# Patient Record
Sex: Female | Born: 1940
Health system: Southern US, Community
[De-identification: ages and names within clinical notes are randomized; demographics above are authoritative.]

## PROBLEM LIST (undated history)

## (undated) DIAGNOSIS — K219 Gastro-esophageal reflux disease without esophagitis: Secondary | ICD-10-CM

## (undated) DIAGNOSIS — H269 Unspecified cataract: Secondary | ICD-10-CM

## (undated) DIAGNOSIS — M1990 Unspecified osteoarthritis, unspecified site: Secondary | ICD-10-CM

## (undated) DIAGNOSIS — Z860100 Personal history of colon polyps, unspecified: Secondary | ICD-10-CM

## (undated) DIAGNOSIS — F419 Anxiety disorder, unspecified: Secondary | ICD-10-CM

## (undated) HISTORY — DX: Anxiety disorder, unspecified: F41.9

## (undated) HISTORY — DX: Personal history of colonic polyps: Z86.010

## (undated) HISTORY — PX: CATARACT EXTRACTION: SUR2

## (undated) HISTORY — DX: Unspecified osteoarthritis, unspecified site: M19.90

## (undated) HISTORY — DX: Unspecified cataract: H26.9

## (undated) HISTORY — DX: Personal history of colon polyps, unspecified: Z86.0100

## (undated) HISTORY — PX: OVARIAN CYST REMOVAL: SHX89

## (undated) HISTORY — DX: Gastro-esophageal reflux disease without esophagitis: K21.9

---

## 2008-12-10 HISTORY — PX: COLONOSCOPY: SHX174

## 2019-05-20 ENCOUNTER — Telehealth: Payer: Self-pay | Admitting: *Deleted

## 2019-05-20 NOTE — Telephone Encounter (Signed)
Left message with female for patient to call back for more information.

## 2019-05-20 NOTE — Telephone Encounter (Signed)
Copied from [place] [phone]. Topic: Appointment Scheduling - Scheduling Inquiry for Clinic >> May 20, 2019 11:23 AM Susan Goodman E wrote: Reason for CRM: Pt wants to establish care with Dr. Jerilee Hoh and wants to get in as soon as possible due to abdominal issue/ please advise

## 2019-05-28 ENCOUNTER — Ambulatory Visit: Payer: Medicare (Managed Care) | Admitting: Internal Medicine

## 2019-05-29 ENCOUNTER — Other Ambulatory Visit: Payer: Self-pay

## 2019-05-29 ENCOUNTER — Ambulatory Visit (INDEPENDENT_AMBULATORY_CARE_PROVIDER_SITE_OTHER): Payer: Medicare Other | Admitting: Internal Medicine

## 2019-05-29 ENCOUNTER — Encounter: Payer: Self-pay | Admitting: Internal Medicine

## 2019-05-29 VITALS — BP 110/64 | HR 68 | Temp 99.0°F | Ht 62.0 in | Wt 163.6 lb

## 2019-05-29 DIAGNOSIS — F419 Anxiety disorder, unspecified: Secondary | ICD-10-CM | POA: Diagnosis not present

## 2019-05-29 DIAGNOSIS — K921 Melena: Secondary | ICD-10-CM | POA: Insufficient documentation

## 2019-05-29 LAB — CBC WITH DIFFERENTIAL/PLATELET
Basophils Absolute: 0 10*3/uL (ref 0.0–0.1)
Basophils Relative: 0.7 % (ref 0.0–3.0)
Eosinophils Absolute: 0.1 10*3/uL (ref 0.0–0.7)
Eosinophils Relative: 1.7 % (ref 0.0–5.0)
HCT: 37.2 % (ref 36.0–46.0)
Hemoglobin: 12.4 g/dL (ref 12.0–15.0)
Lymphocytes Relative: 40.6 % (ref 12.0–46.0)
Lymphs Abs: 1.6 10*3/uL (ref 0.7–4.0)
MCHC: 33.3 g/dL (ref 30.0–36.0)
MCV: 91.7 fl (ref 78.0–100.0)
Monocytes Absolute: 0.3 10*3/uL (ref 0.1–1.0)
Monocytes Relative: 7.6 % (ref 3.0–12.0)
Neutro Abs: 2 10*3/uL (ref 1.4–7.7)
Neutrophils Relative %: 49.4 % (ref 43.0–77.0)
Platelets: 217 10*3/uL (ref 150.0–400.0)
RBC: 4.06 Mil/uL (ref 3.87–5.11)
RDW: 13.7 % (ref 11.5–15.5)
WBC: 4 10*3/uL (ref 4.0–10.5)

## 2019-05-29 LAB — COMPREHENSIVE METABOLIC PANEL
ALT: 30 U/L (ref 0–35)
AST: 24 U/L (ref 0–37)
Albumin: 3.9 g/dL (ref 3.5–5.2)
Alkaline Phosphatase: 69 U/L (ref 39–117)
BUN: 18 mg/dL (ref 6–23)
CO2: 32 mEq/L (ref 19–32)
Calcium: 9.2 mg/dL (ref 8.4–10.5)
Chloride: 103 mEq/L (ref 96–112)
Creatinine, Ser: 0.79 mg/dL (ref 0.40–1.20)
GFR: 70.43 mL/min (ref >60.00)
Glucose, Bld: 81 mg/dL (ref 70–99)
Potassium: 4.3 mEq/L (ref 3.5–5.1)
Sodium: 142 mEq/L (ref 135–145)
Total Bilirubin: 0.3 mg/dL (ref 0.2–1.2)
Total Protein: 5.9 g/dL — ABNORMAL LOW (ref 6.0–8.3)

## 2019-05-29 MED ORDER — PANTOPRAZOLE SODIUM 40 MG PO TBEC: 40.0000 mg | DELAYED_RELEASE_TABLET | Freq: Every day | ORAL | 2 refills | Status: DC

## 2019-05-29 MED ORDER — SERTRALINE HCL 50 MG PO TABS: 50.0000 mg | ORAL_TABLET | Freq: Every day | ORAL | 3 refills | Status: DC

## 2019-05-29 NOTE — Progress Notes (Signed)
New Patient Office Visit     CC/Reason for Visit: Establish care, discuss acute concerns including black stools and increased anxiety Previous PCP: In [place] Last Visit: 2019  HPI: Susan Goodman is a 78 y.o. female who is coming in today for the above mentioned reasons.  She has no past medical history of significance.  About 6 weeks ago she was started on Zoloft and trazodone due to heightened anxiety and stress surrounding the COVID-19 pandemic.  She has moved to [place] to be closer to her niece. She takes trazodone rarely, but niece feels like her anxiety is still significant: she is afraid to be alone at home for example.  The major concern today is black stools. This started about 3 weeks ago but has become more frequent. She has a mild "nagging" pain over her lower abdomen. She has been constipated and has had relief with prune juice. No nausea or vomiting, no diarrhea. She does not use NSAIDS ever, does not drink ETOH; no h/o GERD/PUD or hiatal hernias. Over the past week her niece has noted increased fatigue and mild DOE. No dizziness or weakness. She had a colonoscopy 15 years ago in [place], had polyps removed and was asked to return in 5 years but she never did.   Past Medical/Surgical History: Past Medical History:  Diagnosis Date  . Anxiety     History reviewed. No pertinent surgical history.  Social History:  reports that she has never smoked. She has never used smokeless tobacco. She reports that she does not drink alcohol. No history on file for drug.  Allergies: No Known Allergies  Family History:  History reviewed. No pertinent family history. Specifically, no heart disease, stroke or cancers that they are aware of.   Current Outpatient Medications:  .  traZODone (DESYREL) 50 MG tablet, Take 50 mg by mouth at bedtime., Disp: , Rfl:  .  pantoprazole (PROTONIX) 40 MG tablet, Take 1 tablet (40 mg total) by mouth daily., Disp: 30 tablet, Rfl: 2 .   sertraline (ZOLOFT) 50 MG tablet, Take 1 tablet (50 mg total) by mouth daily., Disp: 30 tablet, Rfl: 3  Review of Systems:  Constitutional: Denies fever, chills, diaphoresis, appetite change. HEENT: Denies photophobia, eye pain, redness, hearing loss, ear pain, congestion, sore throat, rhinorrhea, sneezing, mouth sores, trouble swallowing, neck pain, neck stiffness and tinnitus.   Respiratory: Denies  cough, chest tightness,  and wheezing.   Cardiovascular: Denies chest pain, palpitations and leg swelling.  Gastrointestinal: Denies nausea, vomiting,  Diarrhea and abdominal distention.  Genitourinary: Denies dysuria, urgency, frequency, hematuria, flank pain and difficulty urinating.  Endocrine: Denies: hot or cold intolerance, sweats, changes in hair or nails, polyuria, polydipsia. Musculoskeletal: Denies myalgias, back pain, joint swelling, arthralgias and gait problem.  Skin: Denies pallor, rash and wound.  Neurological: Denies dizziness, seizures, syncope, weakness, light-headedness, numbness and headaches.  Hematological: Denies adenopathy. Easy bruising, personal or family bleeding history  Psychiatric/Behavioral: Denies suicidal ideation, mood changes, confusion, nervousness, sleep disturbance and agitation    Physical Exam: Vitals:   05/29/19 1406  BP: 110/64  Pulse: 68  Temp: 99 F (37.2 C)  TempSrc: Oral  SpO2: 94%  Weight: 163 lb 9.6 oz (74.2 kg)  Height: 5\' 2"  (1.575 m)   Body mass index is 29.92 kg/m.  Constitutional: NAD, calm, comfortable Eyes: PERRL, lids and conjunctivae normal ENMT: Mucous membranes are moist.  Respiratory: clear to auscultation bilaterally, no wheezing, no crackles. Normal respiratory effort. No accessory muscle use.  Cardiovascular: Regular rate and rhythm, no murmurs / rubs / gallops. No extremity edema. 2+ pedal pulses. No carotid bruits.  Abdomen: no tenderness, no masses palpated. No hepatosplenomegaly. Bowel sounds positive.   Musculoskeletal: no clubbing / cyanosis. No joint deformity upper and lower extremities. Good ROM, no contractures. Normal muscle tone.  Neurologic: grossly intact and non-focal Psychiatric: Normal judgment and insight. Alert and oriented x 3. Normal mood.    Impression and Plan:  Melena -Start PPI. -Check CBC/CMET/iron studies today. -Avoid any NSAID use (she doesn't currently). -Will initiate urgent referral to GI. -Since BP ok and not BRBPR, I believe it is ok to evaluate in the OP setting for now. -Patient and niece advised to proceed to the ED this weekend if she has increased bleeding, if bleeding turns red/maroon in color or if she has weakness or dizziness.   Anxiety -Agree with increasing zoloft from 25 to 50 mg daily. -Continue to use trazodone PRN for sleep only.     Patient Instructions  -Nice meeting you today.  -Lab work today; will notify you when results are available.  -Urgent GI referral today to assess your black stools.  -Start taking protonix 40 mg daily.  -Increase zoloft to 50 mg daily.     Lelon Frohlich, MD [facility]

## 2019-05-29 NOTE — Patient Instructions (Signed)
-  Nice meeting you today.  -Lab work today; will notify you when results are available.  -Urgent GI referral today to assess your black stools.  -Start taking protonix 40 mg daily.  -Increase zoloft to 50 mg daily.

## 2019-05-30 LAB — SPECIMEN STATUS REPORT

## 2019-06-02 LAB — ANEMIA PANEL
Ferritin: 132 ng/mL (ref 15–150)
Hematocrit: 38.6 % (ref 34.0–46.6)
Iron Saturation: 37 % (ref 15–55)
Iron: 93 ug/dL (ref 27–139)
Retic Ct Pct: 0.7 % (ref 0.6–2.6)
Total Iron Binding Capacity: 254 ug/dL (ref 250–450)
UIBC: 161 ug/dL (ref 118–369)
Vitamin B-12: 750 pg/mL (ref 232–1245)

## 2019-06-09 ENCOUNTER — Telehealth: Payer: Self-pay | Admitting: General Surgery

## 2019-06-09 NOTE — Telephone Encounter (Signed)
Covid-19 screening questions   Do you now or have you had a fever in the last 14 days?NO  Do you have any respiratory symptoms of shortness of breath or cough now or in the last 14 days? NO  Do you have any family members or close contacts with diagnosed or suspected Covid-19 in the past 14 days? No  Have you been tested for Covid-19 and found to be positive? No  Patient and healthcare partner( Niece) informed to wear a mask to the clinic. Patient and niece verbalized understanding.

## 2019-06-10 ENCOUNTER — Telehealth: Payer: Self-pay | Admitting: Internal Medicine

## 2019-06-10 ENCOUNTER — Telehealth: Payer: Self-pay | Admitting: *Deleted

## 2019-06-10 ENCOUNTER — Encounter: Payer: Self-pay | Admitting: Nurse Practitioner

## 2019-06-10 ENCOUNTER — Ambulatory Visit (INDEPENDENT_AMBULATORY_CARE_PROVIDER_SITE_OTHER): Payer: Medicare Other | Admitting: Nurse Practitioner

## 2019-06-10 ENCOUNTER — Other Ambulatory Visit: Payer: Self-pay

## 2019-06-10 VITALS — BP 116/76 | HR 85 | Temp 96.7°F | Ht 62.0 in | Wt 161.4 lb

## 2019-06-10 DIAGNOSIS — Z86010 Personal history of colonic polyps: Secondary | ICD-10-CM | POA: Diagnosis not present

## 2019-06-10 DIAGNOSIS — K921 Melena: Secondary | ICD-10-CM

## 2019-06-10 MED ORDER — NA SULFATE-K SULFATE-MG SULF 17.5-3.13-1.6 GM/177ML PO SOLN: ORAL | 0 refills | Status: DC

## 2019-06-10 NOTE — Progress Notes (Signed)
Assessment and plan reviewed 

## 2019-06-10 NOTE — Telephone Encounter (Signed)
Copied from [place] [phone]. Topic: General - Other >> Jun 10, 2019 12:14 PM Mathis Bud wrote: Reason for CRM: patient niece called stating patient is requesting a nurse to call back for lab results.  Patient called on 6/29 and never received a call back Call back [phone]

## 2019-06-10 NOTE — Patient Instructions (Addendum)
If you are age 78 or older, your body mass index should be between 23-30. Your Body mass index is 29.52 kg/m. If this is out of the aforementioned range listed, please consider follow up with your Primary Care Provider.  If you are age 83 or younger, your body mass index should be between 19-25. Your Body mass index is 29.52 kg/m. If this is out of the aformentioned range listed, please consider follow up with your Primary Care Provider.   You have been scheduled for an endoscopy and colonoscopy. Please follow the written instructions given to you at your visit today. Please pick up your prep supplies at the pharmacy within the next 1-3 days. If you use inhalers (even only as needed), please bring them with you on the day of your procedure. Your physician has requested that you go to www.startemmi.com and enter the access code given to you at your visit today. This web site gives a general overview about your procedure. However, you should still follow specific instructions given to you by our office regarding your preparation for the procedure.  We have sent the following medications to your pharmacy for you to pick up at your convenience: Suprep Continue Protonix.   Thank you for choosing me and [facility].   Tye Savoy, NP

## 2019-06-10 NOTE — Telephone Encounter (Signed)
Patients niece Angela Cox) called stating she is requesting referral to a neurologist due to  Patient had a fall in [place] early April and went to [facility] and had a CT. Gwenette Greet states she believes they did see something on CT.   Patient would like the neurologist referral to Lenor Coffin, MD at [facility]  Call back # [phone]

## 2019-06-10 NOTE — Progress Notes (Signed)
ASSESSMENT / PLAN:   50.  78 year old electively healthy female with intermittent black stools since May.  Hemoglobin 12.4, no baseline available.  Labs do not suggest iron deficiency. -For further evaluation patient will be scheduled for EGD.  The risks and benefits of EGD were discussed and the patient agrees to proceed.  -Continue PPI in the interim  2.  History of colon polyps in [place], [place] approximately 15 years ago.  Patient was advised to have repeat colonoscopy in 5 years but did not have it done.  -Patient is amazingly healthy, she is interested in pursuing colonoscopy at the time of EGD. The risks and benefits of colonoscopy with possible polypectomy / biopsies were discussed and the patient agrees to proceed.     HPI:    Chief Complaint:   Black stools Patient is a 78 year old female, new to the practice, recently relocated from [place] .  She is referred by PCP Rande Lawman, MD for evaluation of black stool.  Describes intermittent black stools since May.  She takes no NSAIDs.  No Pepto-Bismol, no iron tablets.  Black stool which has occurred intermittently since May.  Patient saw PCP on 6/19, hemoglobin in 12 range.  Patient has occasional vague right mid abdominal discomfort not related to eating, bowel movements, physical activity.  She takes prunes every day to keep her bowels moving.  No history of rectal bleeding.  Overall patient feels well from a GI and general medical standpoint.  Data Reviewed:  05/29/19 Hgb 12.4, platelets 217, MCV 91.  Ferritin 132, TIBC 250, iron saturation 37%   Past Medical History:  Diagnosis Date  . Anxiety     Past Surgical History:  Procedure Laterality Date  . COLONOSCOPY  2010   In [place]   . OVARIAN CYST REMOVAL     1980's   Family History  Problem Relation Age of Onset  . Colon cancer Father   . Esophageal cancer Neg Hx    Social History   Tobacco Use  . Smoking status: Never Smoker  .  Smokeless tobacco: Never Used  Substance Use Topics  . Alcohol use: Never    Frequency: Never  . Drug use: Never   Current Outpatient Medications  Medication Sig Dispense Refill  . loratadine (CLARITIN) 10 MG tablet Take 10 mg by mouth daily.    . sertraline (ZOLOFT) 50 MG tablet Take 1 tablet (50 mg total) by mouth daily. 30 tablet 3  . traZODone (DESYREL) 50 MG tablet Take 50 mg by mouth as needed.     . pantoprazole (PROTONIX) 40 MG tablet Take 1 tablet (40 mg total) by mouth daily. (Patient not taking: Reported on 06/10/2019) 30 tablet 2   No current facility-administered medications for this visit.    No Known Allergies   Review of Systems: All systems reviewed and negative except where noted in HPI.   Serum creatinine: 0.79 mg/dL 05/29/19 1441 Estimated creatinine clearance: 55.1 mL/min   Physical Exam:    Wt Readings from Last 3 Encounters:  06/10/19 161 lb 6 oz (73.2 kg)  05/29/19 163 lb 9.6 oz (74.2 kg)    BP 116/76   Pulse 85   Temp (!) 96.7 F (35.9 C) (Temporal)   Ht 5\' 2"  (1.575 m)   Wt 161 lb 6 oz (73.2 kg)   BMI 29.52 kg/m  Constitutional:  Pleasant female in no acute distress. Psychiatric: Normal mood  and affect. Behavior is normal. EENT: Pupils normal.  Conjunctivae are normal. No scleral icterus. Neck supple.  Cardiovascular: Normal rate, regular rhythm. No edema Pulmonary/chest: Effort normal and breath sounds normal. No wheezing, rales or rhonchi. Abdominal: Soft, nondistended, nontender. Bowel sounds active throughout. There are no masses palpable. No hepatomegaly. Neurological: Alert and oriented to person place and time. Skin: Skin is warm and dry. No rashes noted.  Tye Savoy, NP  06/10/2019, 10:53 AM  Cc: Isaac Bliss, [place]*

## 2019-06-11 NOTE — Telephone Encounter (Signed)
Patient is aware of lab results.

## 2019-06-11 NOTE — Telephone Encounter (Signed)
Spoke with [facility] and new patient paperwork with ROI will be picked up from the office.

## 2019-06-11 NOTE — Telephone Encounter (Signed)
Could we possibly find out what was seen on CT? I need a reason for referral. Thanks

## 2019-06-17 ENCOUNTER — Telehealth: Payer: Self-pay | Admitting: *Deleted

## 2019-06-17 NOTE — Telephone Encounter (Signed)
Copied from [place] [phone]. Topic: General - Other >> Jun 17, 2019 11:26 AM Lennox Solders wrote: Reason for CRM: pt is in needs of new handicap renewal form. Pt move from [place] and had handicap plate. Pt needs a new one due to knee and unable to walk that far.

## 2019-06-22 ENCOUNTER — Telehealth: Payer: Self-pay | Admitting: Nurse Practitioner

## 2019-06-22 ENCOUNTER — Telehealth: Payer: Self-pay | Admitting: Internal Medicine

## 2019-06-22 NOTE — Telephone Encounter (Signed)
Ivar Bury called to inform that request was denied for authorisation; number for appeal [phone].  They will send over denial letter.

## 2019-06-22 NOTE — Telephone Encounter (Signed)
Patient niece called said that the suprep was to expensive and would like something else called in. They need it sent by today to take it tomorrow.

## 2019-06-22 NOTE — Telephone Encounter (Signed)
Patient is coming by the office to pick up [place].

## 2019-06-23 ENCOUNTER — Telehealth: Payer: Self-pay | Admitting: Internal Medicine

## 2019-06-23 NOTE — Telephone Encounter (Signed)
Yes, do I need to sign?

## 2019-06-23 NOTE — Telephone Encounter (Signed)
Spoke with patient regarding Covid-19 screening questions. Covid-19 Screening Questions:  Do you now or have you had a fever in the last 14 days?   Do you have any respiratory symptoms of shortness of breath or cough now or in the last 14 days?   Do you have any family members or close contacts with diagnosed or suspected Covid-19 in the past 14 days?   Have you been tested for Covid-19 and found to be positive?

## 2019-06-23 NOTE — Telephone Encounter (Signed)
Okay to give. 

## 2019-06-23 NOTE — Telephone Encounter (Signed)
Informed niece that the form is ready to pick up.

## 2019-06-23 NOTE — Telephone Encounter (Signed)
Pt returned call: ° °Do you now or have you had a fever in the last 14 days?     No °  °Do you have any respiratory symptoms of shortness of breath or cough now or in the last 14 days?    No °  °Do you have any family members or close contacts with diagnosed or suspected Covid-19 in the past 14 days?  No °  °Have you been tested for Covid-19 and found to be positive?    No °  °Pt made aware of that care partner may come to the lobby during the procedure but will need to provide their own mask. ° ° °

## 2019-06-24 ENCOUNTER — Encounter: Payer: Medicare (Managed Care) | Admitting: Internal Medicine

## 2019-06-24 ENCOUNTER — Telehealth: Payer: Self-pay | Admitting: Internal Medicine

## 2019-06-24 NOTE — Telephone Encounter (Signed)
Patient niece called back. She stated that she is going to cancel however do not want to be charged because she was not aware wasn't approved until the day of. Please call back and advise thanks.

## 2019-06-24 NOTE — Telephone Encounter (Signed)
Patient niece upset because insurance denied ecl that is scheduled for today.  I spoke to [employer] and they state that then only cover [place] approved facilities and in the state of [place]. They mailed pt a letter 07/13 but the address on file was [place] not [place].

## 2019-06-26 NOTE — Telephone Encounter (Signed)
Patient niece called said that the patient is needing to reschedule her procedure since she will be having a new insurance on July 11, 2019. I told her that she will need to call on 07/13/19 to give [place] her new insurance to make sure we do take it.

## 2019-07-09 ENCOUNTER — Telehealth: Payer: Self-pay | Admitting: *Deleted

## 2019-07-09 DIAGNOSIS — F419 Anxiety disorder, unspecified: Secondary | ICD-10-CM

## 2019-07-09 DIAGNOSIS — Z9181 History of falling: Secondary | ICD-10-CM

## 2019-07-09 NOTE — Telephone Encounter (Signed)
Copied from [place] [phone]. Topic: General - Other >> Jul 06, 2019  9:57 AM Sheran Luz wrote: Patient's niece requesting call back from CMA to discuss whether unspecified form has been sent out. She states she has contacted office previously (no documentation) >> Jul 08, 2019  2:09 PM Jodie Echevaria wrote: Patient niece called back to speak to Apolonio Schneiders or Dr Jerilee Hoh asking about sending out to get some reports from another facility for the patient to help aide in her proper care. Please call Colin Broach at  Ph#  [phone] >> Jul 06, 2019 10:18 AM Cox, Melburn Hake, CMA wrote: Caller not on [facility]

## 2019-07-09 NOTE — Telephone Encounter (Signed)
Copied from [place] [phone]. Topic: General - Other >> Jul 06, 2019  9:57 AM Sheran Luz wrote: Patient's niece requesting call back from CMA to discuss whether unspecified form has been sent out. She states she has contacted office previously (no documentation) >> Jul 08, 2019  2:09 PM Jodie Echevaria wrote: Patient niece called back to speak to Apolonio Schneiders or Dr Jerilee Hoh asking about sending out to get some reports from another facility for the patient to help aide in her proper care. Please call Colin Broach at  Ph#  [phone] >> Jul 06, 2019 10:18 AM Cox, Melburn Hake, CMA wrote: Caller not on [facility]

## 2019-07-09 NOTE — Telephone Encounter (Signed)
Okay to refer to home health?

## 2019-07-09 NOTE — Telephone Encounter (Signed)
Yes

## 2019-07-09 NOTE — Telephone Encounter (Signed)
Patient's niece called in requesting a call back from the [place]. States she is unsure why it is taking so long. Call back is [phone].

## 2019-07-10 ENCOUNTER — Other Ambulatory Visit: Payer: Self-pay

## 2019-07-10 ENCOUNTER — Ambulatory Visit (AMBULATORY_SURGERY_CENTER): Payer: Medicare (Managed Care)

## 2019-07-10 VITALS — Ht 62.0 in | Wt 161.0 lb

## 2019-07-10 DIAGNOSIS — K921 Melena: Secondary | ICD-10-CM

## 2019-07-10 DIAGNOSIS — Z86010 Personal history of colonic polyps: Secondary | ICD-10-CM

## 2019-07-10 MED ORDER — NA SULFATE-K SULFATE-MG SULF 17.5-3.13-1.6 GM/177ML PO SOLN: 1.0000 | Freq: Once | ORAL | 0 refills | Status: AC

## 2019-07-10 NOTE — Progress Notes (Signed)
Denies allergies to eggs or soy products. Denies complication of anesthesia or sedation. Denies use of weight loss medication. Denies use of O2.   Emmi instructions given for colonoscopy.  Pre-Visit was conducted by phone due to Covid 19. Instructions were reviewed with patients niece who oversee's her care. A sample of Suprep was given to the patient because she did a prep previously but the procedure had to be cancelled due to insurance. Patient now has a new insurance card and it will be scanned into our system on Monday when the patient picks up the prep. Patient was encouraged to call if she had any questions regarding instructions.

## 2019-07-10 NOTE — Telephone Encounter (Signed)
Orders placed.

## 2019-07-21 ENCOUNTER — Other Ambulatory Visit: Payer: Self-pay | Admitting: Internal Medicine

## 2019-07-21 DIAGNOSIS — Z1231 Encounter for screening mammogram for malignant neoplasm of breast: Secondary | ICD-10-CM

## 2019-07-22 ENCOUNTER — Telehealth: Payer: Self-pay | Admitting: Internal Medicine

## 2019-07-22 DIAGNOSIS — R9089 Other abnormal findings on diagnostic imaging of central nervous system: Secondary | ICD-10-CM

## 2019-07-22 NOTE — Telephone Encounter (Signed)

## 2019-07-22 NOTE — Telephone Encounter (Signed)
Fine with me

## 2019-07-22 NOTE — Telephone Encounter (Signed)
Referral placed.

## 2019-07-22 NOTE — Telephone Encounter (Signed)
Copied from [place] [phone]. Topic: Referral - Status >> Jul 22, 2019  9:04 AM Antonieta Iba C wrote: Reason for CRM: pt's niece Gwenette Greet called in to request a referral to Dr. Jannifer Franklin at [facility].   Please assist   CB: [phone] Gwenette Greet

## 2019-07-23 ENCOUNTER — Encounter: Payer: Self-pay | Admitting: Internal Medicine

## 2019-07-23 ENCOUNTER — Ambulatory Visit (AMBULATORY_SURGERY_CENTER): Payer: Medicare Other | Admitting: Internal Medicine

## 2019-07-23 ENCOUNTER — Other Ambulatory Visit: Payer: Self-pay

## 2019-07-23 VITALS — BP 112/80 | HR 84 | Temp 98.5°F | Resp 19 | Ht 62.0 in | Wt 161.0 lb

## 2019-07-23 DIAGNOSIS — D122 Benign neoplasm of ascending colon: Secondary | ICD-10-CM | POA: Diagnosis not present

## 2019-07-23 DIAGNOSIS — K921 Melena: Secondary | ICD-10-CM | POA: Diagnosis not present

## 2019-07-23 DIAGNOSIS — Z86010 Personal history of colonic polyps: Secondary | ICD-10-CM

## 2019-07-23 DIAGNOSIS — D124 Benign neoplasm of descending colon: Secondary | ICD-10-CM

## 2019-07-23 DIAGNOSIS — K5730 Diverticulosis of large intestine without perforation or abscess without bleeding: Secondary | ICD-10-CM | POA: Diagnosis not present

## 2019-07-23 DIAGNOSIS — D125 Benign neoplasm of sigmoid colon: Secondary | ICD-10-CM | POA: Diagnosis not present

## 2019-07-23 DIAGNOSIS — R195 Other fecal abnormalities: Secondary | ICD-10-CM | POA: Diagnosis not present

## 2019-07-23 MED ORDER — SODIUM CHLORIDE 0.9 % IV SOLN: 500.0000 mL | Freq: Once | INTRAVENOUS | Status: DC

## 2019-07-23 NOTE — Op Note (Signed)
[place] Patient Name: Susan Goodman Procedure Date: 07/23/2019 3:36 PM MRN: [id number] Endoscopist: Docia Chuck. Henrene Pastor , MD Age: 78 Referring MD:  Date of Birth: 1941/07/17 Gender: Female Account #: [id number] Procedure:                Upper GI endoscopy Indications:              Melena (reports of dark stools) hemoglobin 12.4 Medicines:                Monitored Anesthesia Care Procedure:                Pre-Anesthesia Assessment:                           - Prior to the procedure, a History and Physical                            was performed, and patient medications and                            allergies were reviewed. The patient's tolerance of                            previous anesthesia was also reviewed. The risks                            and benefits of the procedure and the sedation                            options and risks were discussed with the patient.                            All questions were answered, and informed consent                            was obtained. Prior Anticoagulants: The patient has                            taken no previous anticoagulant or antiplatelet                            agents. ASA Grade Assessment: II - A patient with                            mild systemic disease. After reviewing the risks                            and benefits, the patient was deemed in                            satisfactory condition to undergo the procedure.                           After obtaining informed consent, the endoscope was  passed under direct vision. Throughout the                            procedure, the patient's blood pressure, pulse, and                            oxygen saturations were monitored continuously. The                            Endoscope was introduced through the mouth, and                            advanced to the second part of duodenum. The upper                            GI  endoscopy was accomplished without difficulty.                            The patient tolerated the procedure well. Scope In: Scope Out: Findings:                 The esophagus was normal.                           The stomach was normal.                           The examined duodenum was normal.                           The cardia and gastric fundus were normal on                            retroflexion. Complications:            No immediate complications. Estimated Blood Loss:     Estimated blood loss: none. Impression:               - Normal esophagus.                           - Normal stomach.                           - Normal examined duodenum.                           - No specimens collected. Recommendation:           - Patient has a contact number available for                            emergencies. The signs and symptoms of potential                            delayed complications were discussed with the  patient. Return to normal activities tomorrow.                            Written discharge instructions were provided to the                            patient.                           - Resume previous diet.                           - Continue present medications.                           - Return to the care of your primary provider Docia Chuck. Henrene Pastor, MD 07/23/2019 4:22:54 PM This report has been signed electronically.

## 2019-07-23 NOTE — Progress Notes (Signed)
Called to room to assist during endoscopic procedure.  Patient ID and intended procedure confirmed with present staff. Received instructions for my participation in the procedure from the performing physician.  

## 2019-07-23 NOTE — Progress Notes (Signed)
PT taken to PACU. Monitors in place. VSS. Report given to RN. 

## 2019-07-23 NOTE — Op Note (Signed)
[place] Patient Name: Susan Goodman Procedure Date: 07/23/2019 3:38 PM MRN: [id number] Endoscopist: Docia Chuck. Henrene Pastor , MD Age: 78 Referring MD:  Date of Birth: [birth year] Gender: Female Account #: [id number] Procedure:                Colonoscopy with cold snare polypectomy x 3 Indications:              High risk colon cancer surveillance: Personal                            history of colonic polyps. Reports having had colon                            polyps in [place] 15 years ago. Was told to                            follow-up in 5 years but has not followed up until                            this time. Was sent to this office regarding this                            issue as well as complaints of "dark stools". Now                            for colonoscopy Medicines:                Monitored Anesthesia Care Procedure:                Pre-Anesthesia Assessment:                           - Prior to the procedure, a History and Physical                            was performed, and patient medications and                            allergies were reviewed. The patient's tolerance of                            previous anesthesia was also reviewed. The risks                            and benefits of the procedure and the sedation                            options and risks were discussed with the patient.                            All questions were answered, and informed consent                            was obtained. Prior Anticoagulants: The patient has  taken no previous anticoagulant or antiplatelet                            agents. ASA Grade Assessment: II - A patient with                            mild systemic disease. After reviewing the risks                            and benefits, the patient was deemed in                            satisfactory condition to undergo the procedure.                           After obtaining  informed consent, the colonoscope                            was passed under direct vision. Throughout the                            procedure, the patient's blood pressure, pulse, and                            oxygen saturations were monitored continuously. The                            Colonoscope was introduced through the anus and                            advanced to the the cecum, identified by                            appendiceal orifice and ileocecal valve. The                            ileocecal valve, appendiceal orifice, and rectum                            were photographed. The quality of the bowel                            preparation was good. The colonoscopy was performed                            without difficulty. The patient tolerated the                            procedure well. The bowel preparation used was                            SUPREP via split dose instruction. Scope In: 3:45:17 PM Scope Out: 4:06:20 PM Scope Withdrawal Time: 0 hours 10 minutes 46 seconds  Total Procedure Duration: 0  hours 21 minutes 3 seconds  Findings:                 Three polyps were found in the sigmoid colon,                            descending colon and ascending colon. The polyps                            were 3 to 5 mm in size. These polyps were removed                            with a cold snare. Resection and retrieval were                            complete.                           Diverticula were found in the sigmoid colon.                           The exam was otherwise without abnormality on                            direct and retroflexion views. Complications:            No immediate complications. Estimated blood loss:                            None. Estimated Blood Loss:     Estimated blood loss: none. Impression:               - Three 3 to 5 mm polyps in the sigmoid colon, in                            the descending colon and in the ascending  colon,                            removed with a cold snare. Resected and retrieved.                           - Diverticulosis in the sigmoid colon.                           - The examination was otherwise normal on direct                            and retroflexion views. Recommendation:           - Repeat colonoscopy is not recommended for                            surveillance.                           - Patient has a contact number available for  emergencies. The signs and symptoms of potential                            delayed complications were discussed with the                            patient. Return to normal activities tomorrow.                            Written discharge instructions were provided to the                            patient.                           - Resume previous diet.                           - Continue present medications.                           - Await pathology results. Docia Chuck. Henrene Pastor, MD 07/23/2019 4:20:14 PM This report has been signed electronically.

## 2019-07-23 NOTE — Patient Instructions (Signed)
Handouts given for polyps, diverticulosis and high fiber diet.  YOU HAD AN ENDOSCOPIC PROCEDURE TODAY AT [facility]:   Refer to the procedure report that was given to you for any specific questions about what was found during the examination.  If the procedure report does not answer your questions, please call your gastroenterologist to clarify.  If you requested that your care partner not be given the details of your procedure findings, then the procedure report has been included in a sealed envelope for you to review at your convenience later.  YOU SHOULD EXPECT: Some feelings of bloating in the abdomen. Passage of more gas than usual.  Walking can help get rid of the air that was put into your GI tract during the procedure and reduce the bloating. If you had a lower endoscopy (such as a colonoscopy or flexible sigmoidoscopy) you may notice spotting of blood in your stool or on the toilet paper. If you underwent a bowel prep for your procedure, you may not have a normal bowel movement for a few days.  Please Note:  You might notice some irritation and congestion in your nose or some drainage.  This is from the oxygen used during your procedure.  There is no need for concern and it should clear up in a day or so.  SYMPTOMS TO REPORT IMMEDIATELY:   Following lower endoscopy (colonoscopy or flexible sigmoidoscopy):  Excessive amounts of blood in the stool  Significant tenderness or worsening of abdominal pains  Swelling of the abdomen that is new, acute  Fever of 100F or higher   Following upper endoscopy (EGD)  Vomiting of blood or coffee ground material  New chest pain or pain under the shoulder blades  Painful or persistently difficult swallowing  New shortness of breath  Fever of 100F or higher  Black, tarry-looking stools  For urgent or emergent issues, a gastroenterologist can be reached at any hour by calling [phone].   DIET:  We do recommend a small meal  at first, but then you may proceed to your regular diet.  Drink plenty of fluids but you should avoid alcoholic beverages for 24 hours.  ACTIVITY:  You should plan to take it easy for the rest of today and you should NOT DRIVE or use heavy machinery until tomorrow (because of the sedation medicines used during the test).    FOLLOW UP: Our staff will call the number listed on your records 48-72 hours following your procedure to check on you and address any questions or concerns that you may have regarding the information given to you following your procedure. If we do not reach you, we will leave a message.  We will attempt to reach you two times.  During this call, we will ask if you have developed any symptoms of COVID 19. If you develop any symptoms (ie: fever, flu-like symptoms, shortness of breath, cough etc.) before then, please call [phone].  If you test positive for Covid 19 in the 2 weeks post procedure, please call and report this information to [place].    If any biopsies were taken you will be contacted by phone or by letter within the next 1-3 weeks.  Please call us at [phone] if you have not heard about the biopsies in 3 weeks.    SIGNATURES/CONFIDENTIALITY: You and/or your care partner have signed paperwork which will be entered into your electronic medical record.  These signatures attest to the fact that that the information above  on your After Visit Summary has been reviewed and is understood.  Full responsibility of the confidentiality of this discharge information lies with you and/or your care-partner.

## 2019-07-27 ENCOUNTER — Telehealth: Payer: Self-pay

## 2019-07-27 NOTE — Telephone Encounter (Signed)
Left message on follow up call. 

## 2019-07-27 NOTE — Telephone Encounter (Signed)
  Follow up Call-  Call back number 07/23/2019  Post procedure Call Back phone  # [phone]  Permission to leave phone message Yes     Patient questions:  Do you have a fever, pain , or abdominal swelling? No. Pain Score  0 *  Have you tolerated food without any problems? Yes.    Have you been able to return to your normal activities? Yes.    Do you have any questions about your discharge instructions: Diet   No. Medications  No. Follow up visit  No.  Do you have questions or concerns about your Care? No.  Actions: * If pain score is 4 or above: 1. No action needed, pain <4.Have you developed a fever since your procedure? no  2.   Have you had an respiratory symptoms (SOB or cough) since your procedure? nno  3.   Have you tested positive for COVID 19 since your procedure no  4.   Have you had any family members/close contacts diagnosed with the COVID 19 since your procedure?  no   If yes to any of these questions please route to Joylene John, RN and Alphonsa Gin, Therapist, sports.

## 2019-07-28 ENCOUNTER — Encounter: Payer: Self-pay | Admitting: Internal Medicine

## 2019-08-03 ENCOUNTER — Encounter: Payer: Self-pay | Admitting: Neurology

## 2019-08-03 ENCOUNTER — Other Ambulatory Visit: Payer: Self-pay

## 2019-08-03 ENCOUNTER — Ambulatory Visit (INDEPENDENT_AMBULATORY_CARE_PROVIDER_SITE_OTHER): Payer: Medicare Other | Admitting: Neurology

## 2019-08-03 VITALS — BP 118/74 | HR 75 | Temp 97.7°F | Ht 62.0 in | Wt 164.0 lb

## 2019-08-03 DIAGNOSIS — R930 Abnormal findings on diagnostic imaging of skull and head, not elsewhere classified: Secondary | ICD-10-CM | POA: Diagnosis not present

## 2019-08-03 NOTE — Progress Notes (Signed)
Provider:  Larey Seat, MD   Referring Provider: Isaac Bliss, [facility]* Primary Care Physician:  Isaac Bliss, Rayford Halsted, MD  Chief Complaint  Patient presents with   New Patient (Initial Visit)    pt with niece, rm 74. pt presents today. used to live in [place]. she went to ER in april in [place] because she hit her head.  CT/ MRI was complete.they wanted her to follow up. she moved here and is getting established here.    HPI:  Susan Goodman is a 78 y.o. female of african- american descent and seen here on 08-03-2019 upon referral  from Dr. Isaac Bliss, whom she had seen for black stools, anxiety.   The patient had bumped her head while still living in [place]- April 10th 2020. Followed by an ED visit in her home state 3 weeks later-  only after onset of an exruciating headache, a feeling of a vice inside her head tightening.    The CT of the brain-I have the report of the CT scan available here that was established on 20 March 2019 at 10:34 AM.   The interpreting physician was Dr. Nicki Reaper face MD.  Physical exam was performed without intravenous contrast, scalp and calvarium described as intact, visualized orbits and sinuses intact ventricles intact there is diffuse cortical and deep white matter atrophy which is described as appropriate for her age.  There were some patchy periventricular and subcortical hypodensities most consistent with mild chronic microvascular changes.  A tiny 6 mm focus of increased attenuation was noted in the left superior frontal subcortical white matter this could represent calcium.  It could also represent an acute or subacute hemorrhage for this reason the study was followed by an MRI of the brain which also made a point that there was no hydrocephalus seen, normal flow voids at present tablets and paraspinals again normal.  No fluid collection the impression was that there was no intracranial hemorrhage no acute finding and that the left  frontal lobe lesion of 6 mm may be a small venous anomaly or a cavernous malformation.    The patient reports she has gotten lost while driving in [place], also in April 2020.  She had since forgotten food on her stove, also while in [place]. She reports she was distracted by a phone call.   She feels she has gotten well used to her new surroundings. She had lived 2 years in a mother in law suite in [place] while not getting well along in the same household.  She had a colonoscopy on 07-26-2019 and no trouble with post anesthesia wake up.   Family history _ Family history of Alzheimer's in her mother, beginning at age 25. 72.  Brother died in a car wrack. Father ; had colon cancer, CAD, bypass,  Died in his 65s had DM and HTN. He went into hypoglycemic coma.One of 15 siblings. One brother died in a heart attack. Bladder Cancer in a younger sister. DM is prevalent.  She worked for [employer], retired in 2010, non smoker, non drinker, caffeine- sometimes coffee. HS graduate, went to trade school.  .      Review of Systems: Out of a complete 14 system review, the patient complains of only the following symptoms, and all other reviewed systems are negative.  headaches have improved.  Highest grade rated at 4-10 intensity, nagging.  Appetite has recovered since she moved to [place]. 14 pounds gain.  Depression.   Social History  Socioeconomic History   Marital status: Divorced    Spouse name: Not on file   Number of children: Not on file   Years of education: Not on file   Highest education level: Not on file  Occupational History   Occupation: Retired  Scientist, product/process development strain: Not on file   Food insecurity    Worry: Not on file    Inability: Not on Lexicographer needs    Medical: Not on file    Non-medical: Not on file  Tobacco Use   Smoking status: Never Smoker   Smokeless tobacco: Never Used  Substance and Sexual Activity   Alcohol use:  Never    Frequency: Never   Drug use: Never   Sexual activity: Not on file  Lifestyle   Physical activity    Days per week: Not on file    Minutes per session: Not on file   Stress: Not on file  Relationships   Social connections    Talks on phone: Not on file    Gets together: Not on file    Attends religious service: Not on file    Active member of club or organization: Not on file    Attends meetings of clubs or organizations: Not on file    Relationship status: Not on file   Intimate partner violence    Fear of current or ex partner: Not on file    Emotionally abused: Not on file    Physically abused: Not on file    Forced sexual activity: Not on file  Other Topics Concern   Not on file  Social History Narrative   Not on file    Family History  Problem Relation Age of Onset   Colon cancer Father    Esophageal cancer Neg Hx    Rectal cancer Neg Hx    Stomach cancer Neg Hx     Past Medical History:  Diagnosis Date   Anxiety    Arthritis    Cataract    GERD (gastroesophageal reflux disease)    History of colon polyps     Past Surgical History:  Procedure Laterality Date   CATARACT EXTRACTION Bilateral    COLONOSCOPY  2010   In [place]    OVARIAN CYST REMOVAL     1980's     Current Outpatient Medications  Medication Sig Dispense Refill   loratadine (CLARITIN) 10 MG tablet Take 10 mg by mouth daily.     pantoprazole (PROTONIX) 40 MG tablet Take 1 tablet (40 mg total) by mouth daily. 30 tablet 2   sertraline (ZOLOFT) 50 MG tablet Take 1 tablet (50 mg total) by mouth daily. 30 tablet 3   traZODone (DESYREL) 50 MG tablet Take 50 mg by mouth as needed.      No current facility-administered medications for this visit.     Allergies as of 08/03/2019   (No Known Allergies)    Vitals: BP 118/74    Pulse 75    Temp 97.7 F (36.5 C)    Ht 5\' 2"  (1.575 m)    Wt 164 lb (74.4 kg)    BMI 30.00 kg/m  Last Weight:  Wt Readings from Last  1 Encounters:  08/03/19 164 lb (74.4 kg)   Last Height:   Ht Readings from Last 1 Encounters:  08/03/19 5\' 2"  (1.575 m)    Physical exam:  General: The patient is awake, alert and appears not in acute distress. The patient is  well groomed. Head: Normocephalic, atraumatic. Neck is supple. Mallampati:5-  , neck circumference:13.75 inches. Mucous lining is pale.  Cardiovascular:  Regular rate and rhythm, without murmurs or carotid bruit, and without distended neck veins. Respiratory: Lungs are clear to auscultation. Skin:  Without evidence of edema, or rash Trunk: BMI is 30,  elevated and patient has normal posture.  Neurologic exam : The patient is awake and alert, oriented to place and time.  Memory subjective described as ' improved ' since not as lonely . There is a normal attention span & concentration ability. Speech is fluent without  dysarthria, dysphonia or aphasia. Mood and affect are appropriate.  Cranial nerves: reports no loss of taste or smell- but she has smelled  ' mold" when others did not.   Pupils are equal and briskly reactive to light. Funduscopic exam deferred.  Extraocular movements  in vertical and horizontal planes intact and without nystagmus.  Visual fields by finger perimetry are intact. Hearing to finger rub intact. Bone conduction intact , but on the left stronger.   Facial sensation intact to fine touch. Facial motor strength is symmetric and tongue and uvula move midline. Tongue protrusion into either cheek is normal. Shoulder shrug is normal.   Motor exam:   Normal tone ,muscle bulk and symmetric strength in all extremities. Sensory:  Fine touch, pinprick and vibration were tested in all extremities. Proprioception was normal. Coordination: Rapid alternating movements in the fingers/hands were normal.  Finger-to-nose maneuver  normal without evidence of ataxia, dysmetria or tremor.  Gait and station: Patient walks without assistive device and is able  unassisted to climb up to the exam table. Strength within normal limits. Stance is stable and normal. Tandem gait is unfragmented.  She did turn with 3 steps. Walks fast and with normal arm swing Deep tendon reflexes: in the upper and lower extremities are symmetric and intact. Babinski maneuver deferred. .   Assessment:  After physical and neurologic examination, review of laboratory studies, imaging, neurophysiology testing and pre-existing records, assessment is that of :   I have no indication (yet) that this patient has dementia, but she appears to have had a period of social stress for about 12 month before moving from [place].   MRI and CT not abnormal for age.   I think a MMSE will be best.   Plan:  Treatment plan and additional workup :  No treatment necessary -  I suggest a repeat visit for [facility] / MMSE trends. 2 month with NP.    Asencion Partridge Beryl Hornberger MD 08/03/2019

## 2019-08-03 NOTE — Patient Instructions (Signed)
Memory Compensation Strategies  1. Use "WARM" strategy.  W= write it down  A= associate it  R= repeat it  M= make a mental note  2.   You can keep a Social worker.  Use a 3-ring notebook with sections for the following: calendar, important names and phone numbers,  medications, doctors' names/phone numbers, lists/reminders, and a section to journal what you did  each day.   3.    Use a calendar to write appointments down.  4.    Write yourself a schedule for the day.  This can be placed on the calendar or in a separate section of the Memory Notebook.  Keeping regular schedules and routines can help memory.  5.    Use medication organizer with sections for each day or morning/evening pills.  You may need help loading it  6.    Keep a basket, or pegboard by the door.  Place items that you need to take out with you in the basket or on the pegboard.  You may also want to include a message board for reminders.  7.    Use sticky notes.  Place sticky notes with reminders in a place where the task is performed.  For example: " turn off the stove" placed by the stove, "lock the door" placed on the door at eye level, " take your medications" on  the bathroom mirror or by the place where you normally take your medications.  8.    Use alarms/timers.  Use while cooking to remind yourself to check on food or as a reminder to take your medicine, or as a  reminder to make a call, or as a reminder to perform another task, etc.

## 2019-08-14 ENCOUNTER — Other Ambulatory Visit: Payer: Self-pay

## 2019-08-14 ENCOUNTER — Ambulatory Visit (INDEPENDENT_AMBULATORY_CARE_PROVIDER_SITE_OTHER): Payer: Medicare Other | Admitting: Internal Medicine

## 2019-08-14 DIAGNOSIS — F419 Anxiety disorder, unspecified: Secondary | ICD-10-CM

## 2019-08-14 DIAGNOSIS — M65352 Trigger finger, left little finger: Secondary | ICD-10-CM | POA: Diagnosis not present

## 2019-08-14 DIAGNOSIS — K921 Melena: Secondary | ICD-10-CM

## 2019-08-14 DIAGNOSIS — M65341 Trigger finger, right ring finger: Secondary | ICD-10-CM

## 2019-08-14 MED ORDER — SERTRALINE HCL 100 MG PO TABS: 100.0000 mg | ORAL_TABLET | Freq: Every day | ORAL | 1 refills | Status: DC

## 2019-08-14 MED ORDER — TRAZODONE HCL 50 MG PO TABS: 50.0000 mg | ORAL_TABLET | ORAL | 1 refills | Status: DC | PRN

## 2019-08-14 NOTE — Progress Notes (Signed)
Virtual Visit via Telephone Note  I connected with Susan Goodman on 08/14/19 at 11:30 AM EDT by telephone and verified that I am speaking with the correct person using two identifiers.   I discussed the limitations, risks, security and privacy concerns of performing an evaluation and management service by telephone and the availability of in person appointments. I also discussed with the patient that there may be a patient responsible charge related to this service. The patient expressed understanding and agreed to proceed.  Location patient: home Location provider: work office Participants present for the call: patient, provider, daughter Patient did not have a visit in the prior 7 days to address this/these issue(s).   History of Present Illness:  She needs refills of her trazodone 50 mg that she takes PRN for sleep. She has noted improvement on zoloft 50 but would like to increase dose to 100 as she is still having some anxiety.  Since I last saw her for melena, she had a f/u with GI for melena. Had EGD and colonoscopy that were unremarkable other than some colon polyps that were removed.  She has noted that her left pinky and her right ring finger stay bent in a down position and she needs to physically pull them up. She has a "knot" on her palm under each of those fingers.   Observations/Objective: Patient sounds cheerful and well on the phone. I do not appreciate any increased work of breathing. Speech and thought processing are grossly intact. Patient reported vitals: none reported   Current Outpatient Medications:  .  loratadine (CLARITIN) 10 MG tablet, Take 10 mg by mouth daily., Disp: , Rfl:  .  sertraline (ZOLOFT) 50 MG tablet, Take 1 tablet (50 mg total) by mouth daily., Disp: 30 tablet, Rfl: 3 .  traZODone (DESYREL) 50 MG tablet, Take 50 mg by mouth as needed. , Disp: , Rfl:   Review of Systems:  Constitutional: Denies fever, chills, diaphoresis,  appetite change and fatigue.  HEENT: Denies photophobia, eye pain, redness, hearing loss, ear pain, congestion, sore throat, rhinorrhea, sneezing, mouth sores, trouble swallowing, neck pain, neck stiffness and tinnitus.   Respiratory: Denies SOB, DOE, cough, chest tightness,  and wheezing.   Cardiovascular: Denies chest pain, palpitations and leg swelling.  Gastrointestinal: Denies nausea, vomiting, abdominal pain, diarrhea, constipation, blood in stool and abdominal distention.  Genitourinary: Denies dysuria, urgency, frequency, hematuria, flank pain and difficulty urinating.  Endocrine: Denies: hot or cold intolerance, sweats, changes in hair or nails, polyuria, polydipsia. Musculoskeletal: Denies myalgias, back pain, joint swelling, arthralgias and gait problem.  Skin: Denies pallor, rash and wound.  Neurological: Denies dizziness, seizures, syncope, weakness, light-headedness, numbness and headaches.  Hematological: Denies adenopathy. Easy bruising, personal or family bleeding history  Psychiatric/Behavioral: Denies suicidal ideation, mood changes, confusion, nervousness, sleep disturbance and agitation   Assessment and Plan:  Anxiety -Increase zoloft to 100.  -Refill trazodone that she uses PRN for sleep.  Melena -Since no findings on EGD/c-scope, will DC PPI that I had started last visit. See no recs from GI to continue on the EGD report.  Trigger ring finger of right hand Trigger little finger of left hand -Will place referral to [facility] for trigger finger injection.    I discussed the assessment and treatment plan with the patient. The patient was provided an opportunity to ask questions and all were answered. The patient agreed with the plan and demonstrated an understanding of the instructions.   The patient was advised to  call back or seek an in-person evaluation if the symptoms worsen or if the condition fails to improve as anticipated.  I provided 23 minutes of  non-face-to-face time during this encounter.   Lelon Frohlich, MD [facility]

## 2019-08-16 ENCOUNTER — Telehealth: Payer: Self-pay | Admitting: *Deleted

## 2019-08-16 NOTE — Telephone Encounter (Signed)
Patient's niece called after hours line. Per patient's niece  the meds are not at the pharmacy and states the location is no longer in business. Her neice states they need the meds (Zoloft,Trazodone, and Protonix) transferred to a different [facility]. Denies having symptoms.

## 2019-08-20 NOTE — Telephone Encounter (Signed)
Spoke with niece and patient has her medications

## 2019-09-08 ENCOUNTER — Other Ambulatory Visit: Payer: Self-pay

## 2019-09-08 ENCOUNTER — Ambulatory Visit
Admission: RE | Admit: 2019-09-08 | Discharge: 2019-09-08 | Disposition: A | Discharge status: Home / Self Care | Payer: Medicare Other | Source: Ambulatory Visit | Attending: Internal Medicine | Admitting: Internal Medicine

## 2019-09-08 DIAGNOSIS — Z1231 Encounter for screening mammogram for malignant neoplasm of breast: Secondary | ICD-10-CM | POA: Diagnosis not present

## 2019-09-15 ENCOUNTER — Telehealth: Payer: Medicare Other | Admitting: Internal Medicine

## 2019-09-23 ENCOUNTER — Telehealth (INDEPENDENT_AMBULATORY_CARE_PROVIDER_SITE_OTHER): Payer: Medicare Other | Admitting: Internal Medicine

## 2019-09-23 ENCOUNTER — Other Ambulatory Visit: Payer: Self-pay

## 2019-09-23 DIAGNOSIS — F419 Anxiety disorder, unspecified: Secondary | ICD-10-CM

## 2019-09-23 NOTE — Progress Notes (Signed)
    Virtual Visit via Telephone Note  I connected with Susan Goodman on 09/23/19 at  3:00 PM EDT by telephone and verified that I am speaking with the correct person using two identifiers.   I discussed the limitations, risks, security and privacy concerns of performing an evaluation and management service by telephone and the availability of in person appointments. I also discussed with the patient that there may be a patient responsible charge related to this service. The patient expressed understanding and agreed to proceed.  Location patient: home Location provider: work office Participants present for the call: patient, provider, niece Patient did not have a visit in the prior 7 days to address this/these issue(s).   History of Present Illness:  Her niece has scheduled this visit due to increased anxiety despite increasing zoloft from 50 to 100 in the beginning of September. She is fearful of staying at home alone at night, afraid of sleeping without lights, states she had a "traumatic experience" with a friend but is unable/unwilling to elaborate. She thinks counseling sessions would be beneficial. Her niece knows someone that can assist.   Observations/Objective: Patient sounds cheerful and well on the phone. I do not appreciate any increased work of breathing. Speech and thought processing are grossly intact. Patient reported vitals: none reported   Current Outpatient Medications:  .  loratadine (CLARITIN) 10 MG tablet, Take 10 mg by mouth daily., Disp: , Rfl:  .  sertraline (ZOLOFT) 100 MG tablet, Take 1 tablet (100 mg total) by mouth daily., Disp: 90 tablet, Rfl: 1 .  traZODone (DESYREL) 50 MG tablet, Take 1 tablet (50 mg total) by mouth as needed., Disp: 90 tablet, Rfl: 1  Review of Systems:  Constitutional: Denies fever, chills, diaphoresis, appetite change and fatigue.  HEENT: Denies photophobia, eye pain, redness, hearing loss, ear pain, congestion, sore  throat, rhinorrhea, sneezing, mouth sores, trouble swallowing, neck pain, neck stiffness and tinnitus.   Respiratory: Denies SOB, DOE, cough, chest tightness,  and wheezing.   Cardiovascular: Denies chest pain, palpitations and leg swelling.  Gastrointestinal: Denies nausea, vomiting, abdominal pain, diarrhea, constipation, blood in stool and abdominal distention.  Genitourinary: Denies dysuria, urgency, frequency, hematuria, flank pain and difficulty urinating.  Endocrine: Denies: hot or cold intolerance, sweats, changes in hair or nails, polyuria, polydipsia. Musculoskeletal: Denies myalgias, back pain, joint swelling, arthralgias and gait problem.  Skin: Denies pallor, rash and wound.  Neurological: Denies dizziness, seizures, syncope, weakness, light-headedness, numbness and headaches.  Hematological: Denies adenopathy. Easy bruising, personal or family bleeding history  Psychiatric/Behavioral: Denies suicidal ideation, mood changes, confusion, nervousness, sleep disturbance and agitation   Assessment and Plan:  Anxiety -Continue zoloft 100 mg. -Agree with CBT sessions; niece will help her arrange. -If no improvement with CBT, can consider adding low dose klonopin at bedtime or other meds.   I discussed the assessment and treatment plan with the patient. The patient was provided an opportunity to ask questions and all were answered. The patient agreed with the plan and demonstrated an understanding of the instructions.   The patient was advised to call back or seek an in-person evaluation if the symptoms worsen or if the condition fails to improve as anticipated.  I provided 15 minutes of non-face-to-face time during this encounter.   Lelon Frohlich, MD [facility]

## 2019-09-28 ENCOUNTER — Ambulatory Visit: Payer: Self-pay

## 2019-09-28 ENCOUNTER — Encounter: Payer: Self-pay | Admitting: Family Medicine

## 2019-09-28 ENCOUNTER — Other Ambulatory Visit: Payer: Self-pay

## 2019-09-28 ENCOUNTER — Ambulatory Visit (INDEPENDENT_AMBULATORY_CARE_PROVIDER_SITE_OTHER): Payer: Medicare Other | Admitting: Family Medicine

## 2019-09-28 VITALS — BP 110/60 | HR 78 | Ht 62.0 in | Wt 159.0 lb

## 2019-09-28 DIAGNOSIS — M24541 Contracture, right hand: Secondary | ICD-10-CM

## 2019-09-28 DIAGNOSIS — M24542 Contracture, left hand: Secondary | ICD-10-CM | POA: Diagnosis not present

## 2019-09-28 DIAGNOSIS — M79642 Pain in left hand: Secondary | ICD-10-CM

## 2019-09-28 NOTE — Progress Notes (Signed)
[facility] [address] [place], [place] [address] Phone: [phone] Subjective:   I Kandace Blitz am serving as a Education administrator for Dr. Hulan Saas.   CC: Bilateral hand pain  [id number]  Susan Goodman is a 78 y.o. female coming in with complaint of bilateral hand pain. Numbness and tingling bilaterally. Pinky contracture as well as other fingers on the left hand.    Onset- Chronic  Location - bilateral hands  Duration-  Character-dull, throbbing aching pain. Aggravating factors- ADL, knobs, opening cans  Reliving factors-  Therapies tried-  Severity-  10/10     Past Medical History:  Diagnosis Date  . Anxiety   . Arthritis   . Cataract   . GERD (gastroesophageal reflux disease)   . History of colon polyps    Past Surgical History:  Procedure Laterality Date  . CATARACT EXTRACTION Bilateral   . COLONOSCOPY  2010   In [place]   . OVARIAN CYST REMOVAL     1980's    Social History   Socioeconomic History  . Marital status: Divorced    Spouse name: Not on file  . Number of children: Not on file  . Years of education: Not on file  . Highest education level: Not on file  Occupational History  . Occupation: Retired  Scientific laboratory technician  . Financial resource strain: Not on file  . Food insecurity    Worry: Not on file    Inability: Not on file  . Transportation needs    Medical: Not on file    Non-medical: Not on file  Tobacco Use  . Smoking status: Never Smoker  . Smokeless tobacco: Never Used  Substance and Sexual Activity  . Alcohol use: Never    Frequency: Never  . Drug use: Never  . Sexual activity: Not on file  Lifestyle  . Physical activity    Days per week: Not on file    Minutes per session: Not on file  . Stress: Not on file  Relationships  . Social Herbalist on phone: Not on file    Gets together: Not on file    Attends religious service: Not on file    Active member of club or organization:  Not on file    Attends meetings of clubs or organizations: Not on file    Relationship status: Not on file  Other Topics Concern  . Not on file  Social History Narrative  . Not on file   No Known Allergies Family History  Problem Relation Age of Onset  . Colon cancer Father   . Esophageal cancer Neg Hx   . Rectal cancer Neg Hx   . Stomach cancer Neg Hx   . Breast cancer Neg Hx       Current Outpatient Medications (Respiratory):  .  loratadine (CLARITIN) 10 MG tablet, Take 10 mg by mouth daily.    Current Outpatient Medications (Other):  .  sertraline (ZOLOFT) 100 MG tablet, Take 1 tablet (100 mg total) by mouth daily. .  traZODone (DESYREL) 50 MG tablet, Take 1 tablet (50 mg total) by mouth as needed.    Past medical history, social, surgical and family history all reviewed in electronic medical record.  No pertanent information unless stated regarding to the chief complaint.   Review of Systems:  No headache, visual changes, nausea, vomiting, diarrhea, constipation, dizziness, abdominal pain, skin rash, fevers, chills, night sweats, weight loss, swollen lymph nodes, body aches, joint  swelling, muscle aches, chest pain, shortness of breath, mood changes.   Objective  Blood pressure 110/60, pulse 78, height 5\' 2"  (1.575 m), weight 159 lb (72.1 kg), SpO2 98 %.    General: No apparent distress alert and oriented x3 mood and affect normal, dressed appropriately.  HEENT: Pupils equal, extraocular movements intact  Respiratory: Patient's speak in full sentences and does not appear short of breath  Cardiovascular: No lower extremity edema, non tender, no erythema  Skin: Warm dry intact with no signs of infection or rash on extremities or on axial skeleton.  Abdomen: Soft nontender  Neuro: Cranial nerves II through XII are intact, neurovascularly intact in all extremities with 2+ DTRs and 2+ pulses.  Lymph: No lymphadenopathy of posterior or anterior cervical chain or axillae  bilaterally.  Gait normal with good balance and coordination.  MSK:  tender with limited range of motion and good stability and symmetric strength and tone of shoulders, elbows, wrist, hip, knee and ankles bilaterally.  Mild to moderate arthritic changes of multiple joints  And exam though shows on patient was left hand does have a deep putrid contracture of the small finger on the right hand and mild starting on the ring finger on the left hand.  Tender to palpation in the areas.  Neurovascularly intact distally.  Good grip strength otherwise.  Does not appear to have any significant atrophy at this time.  Procedure: Real-time Ultrasound Guided Injection of left fifth flexor tendon sheath Device: GE Logiq Q7 Ultrasound guided injection is preferred based studies that show increased duration, increased effect, greater accuracy, decreased procedural pain, increased response rate, and decreased cost with ultrasound guided versus blind injection.  Verbal informed consent obtained.  Time-out conducted.  Noted no overlying erythema, induration, or other signs of local infection.  Skin prepped in a sterile fashion.  Local anesthesia: Topical Ethyl chloride.  With sterile technique and under real time ultrasound guidance: With a 25-gauge half inch needle injected with 0.5 cc of 0.5% Marcaine and 0.5 cc of Kenalog 40 mg/mL. Completed without difficulty  Pain immediately resolved suggesting accurate placement of the medication.  Advised to call if fevers/chills, erythema, induration, drainage, or persistent bleeding.  Images permanently stored and available for review in the ultrasound unit.  Impression: Technically successful ultrasound guided injection.  Procedure: Real-time Ultrasound Guided Injection of left ring finger flexor tendon sheath Device: GE Logiq Q7 Ultrasound guided injection is preferred based studies that show increased duration, increased effect, greater accuracy, decreased procedural  pain, increased response rate, and decreased cost with ultrasound guided versus blind injection.  Verbal informed consent obtained.  Time-out conducted.  Noted no overlying erythema, induration, or other signs of local infection.  Skin prepped in a sterile fashion.  Local anesthesia: Topical Ethyl chloride.  With sterile technique and under real time ultrasound guidance: With a 25-gauge half inch needle injected with 0.5 cc of 0.5% Marcaine and 0.5 cc of Kenalog 40 mg/mL Completed without difficulty  Pain immediately resolved suggesting accurate placement of the medication.  Advised to call if fevers/chills, erythema, induration, drainage, or persistent bleeding.  Images permanently stored and available for review in the ultrasound unit.  Impression: Technically successful ultrasound guided injection.    Impression and Recommendations:     This case required medical decision making of moderate complexity. The above documentation has been reviewed and is accurate and complete Lyndal Pulley, DO       Note: This dictation was prepared with Dragon dictation along with  smaller phrase technology. Any transcriptional errors that result from this process are unintentional.

## 2019-09-28 NOTE — Assessment & Plan Note (Signed)
Contracture noted.  Seems to be more in the fifth 1 on the left side and the fourth 1 on the right side.  Concern for the possibility of genetic aspect of this.  Patient is to increase activity, icing regimen, which activities to do which wants to avoid.  Patient will increase activity as tolerated.  Follow-up 4 to 8 weeks may need repeat injection trigger finger.

## 2019-09-28 NOTE — Patient Instructions (Addendum)
Good to see you  See me again in 4-6 weeks Keep fingers straight Depuytren's Contracture

## 2019-10-05 ENCOUNTER — Ambulatory Visit: Payer: Self-pay | Admitting: Family Medicine

## 2019-10-05 ENCOUNTER — Encounter: Payer: Self-pay | Admitting: Family Medicine

## 2019-10-05 NOTE — Progress Notes (Deleted)
PATIENT: Susan Goodman DOB: November 20, 1941  REASON FOR VISIT: follow up HISTORY FROM: patient  No chief complaint on file.    HISTORY OF PRESENT ILLNESS: Today 10/05/19 Susan Goodman is a 78 y.o. female here today for follow up for memory concerns. She was seen by Dr Brett Fairy in 07/2019. CT and MRI were unremarkable. She was advised to return today for MMSE testing.    HISTORY: (copied from Dr Dohmeier's note on 08/03/2019)  HPI:  Susan Goodman is a 78 y.o. female of african- american descent and seen here on 08-03-2019 upon referral  from Dr. Isaac Bliss, whom she had seen for black stools, anxiety.   The patient had bumped her head while still living in [place]- April 10th 2020. Followed by an ED visit in her home state 3 weeks later-  only after onset of an exruciating headache, a feeling of a vice inside her head tightening.   The CT of the brain-I have the report of the CT scan available here that was established on 20 March 2019 at 10:34 AM. The interpreting physician was Dr. Nicki Reaper face MD.  Physical exam was performed without intravenous contrast, scalp and calvarium described as intact, visualized orbits and sinuses intact ventricles intact there is diffuse cortical and deep white matter atrophy which is described as appropriate for her age.  There were some patchy periventricular and subcortical hypodensities most consistent with mild chronic microvascular changes.  A tiny 6 mm focus of increased attenuation was noted in the left superior frontal subcortical white matter this could represent calcium.  It could also represent an acute or subacute hemorrhage for this reason the study was followed by an MRI of the brain which also made a point that there was no hydrocephalus seen, normal flow voids at present tablets and paraspinals again normal.  No fluid collection the impression was that there was no intracranial hemorrhage no acute finding and that  the left frontal lobe lesion of 6 mm may be a small venous anomaly or a cavernous malformation.  The patient reports she has gotten lost while driving in [place], also in April 2020.  She had since forgotten food on her stove, also while in [place]. She reports she was distracted by a phone call.   She feels she has gotten well used to her new surroundings. She had lived 2 years in a mother in law suite in [place] while not getting well along in the same household.  She had a colonoscopy on 07-26-2019 and no trouble with post anesthesia wake up.   Family history _ Family history of Alzheimer's in her mother, beginning at age 15. 66.  Brother died in a car wrack. Father ; had colon cancer, CAD, bypass,  Died in his 27s had DM and HTN. He went into hypoglycemic coma.One of 15 siblings. One brother died in a heart attack. Bladder Cancer in a younger sister. DM is prevalent.  She worked for [employer], retired in 2010, non smoker, non drinker, caffeine- sometimes coffee. HS graduate, went to trade school.  Marland Kitchen   REVIEW OF SYSTEMS: Out of a complete 14 system review of symptoms, the patient complains only of the following symptoms, and all other reviewed systems are negative.  ALLERGIES: No Known Allergies  HOME MEDICATIONS: Outpatient Medications Prior to Visit  Medication Sig Dispense Refill  . loratadine (CLARITIN) 10 MG tablet Take 10 mg by mouth daily.    . sertraline (ZOLOFT) 100 MG tablet Take 1 tablet (  100 mg total) by mouth daily. 90 tablet 1  . traZODone (DESYREL) 50 MG tablet Take 1 tablet (50 mg total) by mouth as needed. 90 tablet 1   No facility-administered medications prior to visit.     PAST MEDICAL HISTORY: Past Medical History:  Diagnosis Date  . Anxiety   . Arthritis   . Cataract   . GERD (gastroesophageal reflux disease)   . History of colon polyps     PAST SURGICAL HISTORY: Past Surgical History:  Procedure Laterality Date  . CATARACT EXTRACTION Bilateral    . COLONOSCOPY  2010   In [place]   . OVARIAN CYST REMOVAL     1980's     FAMILY HISTORY: Family History  Problem Relation Age of Onset  . Colon cancer Father   . Esophageal cancer Neg Hx   . Rectal cancer Neg Hx   . Stomach cancer Neg Hx   . Breast cancer Neg Hx     SOCIAL HISTORY: Social History   Socioeconomic History  . Marital status: Divorced    Spouse name: Not on file  . Number of children: Not on file  . Years of education: Not on file  . Highest education level: Not on file  Occupational History  . Occupation: Retired  Scientific laboratory technician  . Financial resource strain: Not on file  . Food insecurity    Worry: Not on file    Inability: Not on file  . Transportation needs    Medical: Not on file    Non-medical: Not on file  Tobacco Use  . Smoking status: Never Smoker  . Smokeless tobacco: Never Used  Substance and Sexual Activity  . Alcohol use: Never    Frequency: Never  . Drug use: Never  . Sexual activity: Not on file  Lifestyle  . Physical activity    Days per week: Not on file    Minutes per session: Not on file  . Stress: Not on file  Relationships  . Social Herbalist on phone: Not on file    Gets together: Not on file    Attends religious service: Not on file    Active member of club or organization: Not on file    Attends meetings of clubs or organizations: Not on file    Relationship status: Not on file  . Intimate partner violence    Fear of current or ex partner: Not on file    Emotionally abused: Not on file    Physically abused: Not on file    Forced sexual activity: Not on file  Other Topics Concern  . Not on file  Social History Narrative  . Not on file      PHYSICAL EXAM  There were no vitals filed for this visit. There is no height or weight on file to calculate BMI.  Generalized: Well developed, in no acute distress  Cardiology: normal rate and rhythm, no murmur noted Neurological examination  Mentation: Alert  oriented to time, place, history taking. Follows all commands speech and language fluent Cranial nerve II-XII: Pupils were equal round reactive to light. Extraocular movements were full, visual field were full on confrontational test. Facial sensation and strength were normal. Uvula tongue midline. Head turning and shoulder shrug  were normal and symmetric. Motor: The motor testing reveals 5 over 5 strength of all 4 extremities. Good symmetric motor tone is noted throughout.  Sensory: Sensory testing is intact to soft touch on all 4 extremities. No evidence  of extinction is noted.  Coordination: Cerebellar testing reveals good finger-nose-finger and heel-to-shin bilaterally.  Gait and station: Gait is normal. Tandem gait is normal. Romberg is negative. No drift is seen.  Reflexes: Deep tendon reflexes are symmetric and normal bilaterally.   DIAGNOSTIC DATA (LABS, IMAGING, TESTING) - I reviewed patient records, labs, notes, testing and imaging myself where available.  No flowsheet data found.   Lab Results  Component Value Date   WBC 4.0 05/29/2019   HGB 12.4 05/29/2019   HCT 38.6 05/29/2019   HCT 37.2 05/29/2019   MCV 91.7 05/29/2019   PLT 217.0 05/29/2019      Component Value Date/Time   NA 142 05/29/2019 1441   K 4.3 05/29/2019 1441   CL 103 05/29/2019 1441   CO2 32 05/29/2019 1441   GLUCOSE 81 05/29/2019 1441   BUN 18 05/29/2019 1441   CREATININE 0.79 05/29/2019 1441   CALCIUM 9.2 05/29/2019 1441   PROT 5.9 (L) 05/29/2019 1441   ALBUMIN 3.9 05/29/2019 1441   AST 24 05/29/2019 1441   ALT 30 05/29/2019 1441   ALKPHOS 69 05/29/2019 1441   BILITOT 0.3 05/29/2019 1441   No results found for: CHOL, HDL, LDLCALC, LDLDIRECT, TRIG, CHOLHDL No results found for: HGBA1C Lab Results  Component Value Date   VITAMINB12 750 05/29/2019   No results found for: TSH     ASSESSMENT AND PLAN 78 y.o. year old female  has a past medical history of Anxiety, Arthritis, Cataract, GERD  (gastroesophageal reflux disease), and History of colon polyps. here with ***  No diagnosis found.     No orders of the defined types were placed in this encounter.    No orders of the defined types were placed in this encounter.     I spent 15 minutes with the patient. 50% of this time was spent counseling and educating patient on plan of care and medications.    Debbora Presto, FNP-C 10/05/2019, 7:59 AM [facility] [address] [place], [place] [address] [phone]

## 2019-11-11 ENCOUNTER — Telehealth: Payer: Self-pay

## 2019-11-11 NOTE — Telephone Encounter (Signed)
Called and spoke with Susan Goodman and she states that the patient cancelled her 11/13/2019 appointment with Dr Tamala Julian due to not following previous instructions. States that she has a few concerns about those instructions and would like a call back to discuss. Please advise.  CB#: [phone]

## 2019-11-13 ENCOUNTER — Ambulatory Visit: Payer: Medicare Other | Admitting: Family Medicine

## 2019-11-25 ENCOUNTER — Other Ambulatory Visit: Payer: Self-pay

## 2019-11-25 ENCOUNTER — Encounter: Payer: Self-pay | Admitting: Emergency Medicine

## 2019-11-25 ENCOUNTER — Ambulatory Visit
Admission: RE | Admit: 2019-11-25 | Discharge: 2019-11-25 | Disposition: A | Discharge status: Home / Self Care | Payer: Medicare Other | Source: Ambulatory Visit | Attending: Emergency Medicine | Admitting: Emergency Medicine

## 2019-11-25 ENCOUNTER — Ambulatory Visit
Admission: EM | Admit: 2019-11-25 | Discharge: 2019-11-25 | Disposition: A | Discharge status: Home / Self Care | Payer: Medicare Other | Attending: Emergency Medicine | Admitting: Emergency Medicine

## 2019-11-25 DIAGNOSIS — S52592A Other fractures of lower end of left radius, initial encounter for closed fracture: Secondary | ICD-10-CM | POA: Insufficient documentation

## 2019-11-25 DIAGNOSIS — S52615A Nondisplaced fracture of left ulna styloid process, initial encounter for closed fracture: Secondary | ICD-10-CM

## 2019-11-25 DIAGNOSIS — M25532 Pain in left wrist: Secondary | ICD-10-CM | POA: Insufficient documentation

## 2019-11-25 DIAGNOSIS — W19XXXA Unspecified fall, initial encounter: Secondary | ICD-10-CM | POA: Insufficient documentation

## 2019-11-25 NOTE — Discharge Instructions (Addendum)
Go to [facility] for your wrist xray.  I will call you this afternoon with the results.    Rest and elevate your wrist.  Apply ice packs 2-3 times a day for up to 20 minutes each.  Wear the wrist splint.    Follow up with an orthopedist as directed.

## 2019-11-25 NOTE — ED Provider Notes (Signed)
Roderic Palau    CSN: [id number] Arrival date & time: 11/25/19  1419      History   Chief Complaint Chief Complaint  Patient presents with  . Wrist Injury    HPI Marqita Becerril is a 78 y.o. female.   Patient presents with pain and swelling in her left wrist x 2 weeks.  She states this began after she fell at home while trying to put on her pants.  She reports the pain is worse with ROM and palpation of wrist.  She has been treating her symptoms at home with Epsom Salt soaks.  She denies numbness, tingling, or weakness in her hand/wrist/arm.   The history is provided by the patient.    Past Medical History:  Diagnosis Date  . Anxiety   . Arthritis   . Cataract   . GERD (gastroesophageal reflux disease)   . History of colon polyps     Patient Active Problem List   Diagnosis Date Noted  . Contracture of joint of finger, left 09/28/2019  . Melena 05/29/2019  . Anxiety     Past Surgical History:  Procedure Laterality Date  . CATARACT EXTRACTION Bilateral   . COLONOSCOPY  2010   In [place]   . OVARIAN CYST REMOVAL     1980's     OB History   No obstetric history on file.      Home Medications    Prior to Admission medications   Medication Sig Start Date End Date Taking? Authorizing Provider  loratadine (CLARITIN) 10 MG tablet Take 10 mg by mouth daily.    [provider]  sertraline (ZOLOFT) 100 MG tablet Take 1 tablet (100 mg total) by mouth daily. 08/14/19   Isaac Bliss, Rayford Halsted, MD  traZODone (DESYREL) 50 MG tablet Take 1 tablet (50 mg total) by mouth as needed. 08/14/19   Erline Hau, MD    Family History Family History  Problem Relation Age of Onset  . Colon cancer Father   . Esophageal cancer Neg Hx   . Rectal cancer Neg Hx   . Stomach cancer Neg Hx   . Breast cancer Neg Hx     Social History Social History   Tobacco Use  . Smoking status: Never Smoker  . Smokeless tobacco: Never Used  Substance  Use Topics  . Alcohol use: Never  . Drug use: Never     Allergies   Patient has no known allergies.   Review of Systems Review of Systems  Constitutional: Negative for chills and fever.  HENT: Negative for ear pain and sore throat.   Eyes: Negative for pain and visual disturbance.  Respiratory: Negative for cough and shortness of breath.   Cardiovascular: Negative for chest pain and palpitations.  Gastrointestinal: Negative for abdominal pain and vomiting.  Genitourinary: Negative for dysuria and hematuria.  Musculoskeletal: Positive for arthralgias. Negative for back pain.  Skin: Negative for color change and rash.  Neurological: Negative for seizures, syncope, weakness and numbness.  All other systems reviewed and are negative.    Physical Exam Triage Vital Signs ED Triage Vitals  Enc Vitals Group     BP 11/25/19 1431 127/84     Pulse Rate 11/25/19 1431 67     Resp 11/25/19 1431 18     Temp 11/25/19 1431 97.6 F (36.4 C)     Temp src --      SpO2 11/25/19 1431 97 %     Weight 11/25/19 1432 160  lb (72.6 kg)     Height --      Head Circumference --      Peak Flow --      Pain Score --      Pain Loc --      Pain Edu? --      Excl. in [place]? --    No data found.  Updated Vital Signs BP 127/84 (BP Location: Right Arm)   Pulse 67   Temp 97.6 F (36.4 C)   Resp 18   Wt 160 lb (72.6 kg)   SpO2 97%   BMI 29.26 kg/m   Visual Acuity Right Eye Distance:   Left Eye Distance:   Bilateral Distance:    Right Eye Near:   Left Eye Near:    Bilateral Near:     Physical Exam Vitals and nursing note reviewed.  Constitutional:      General: She is not in acute distress.    Appearance: She is well-developed.  HENT:     Head: Normocephalic and atraumatic.  Eyes:     Conjunctiva/sclera: Conjunctivae normal.  Cardiovascular:     Rate and Rhythm: Normal rate and regular rhythm.     Heart sounds: No murmur.  Pulmonary:     Effort: Pulmonary effort is normal. No  respiratory distress.     Breath sounds: Normal breath sounds.  Abdominal:     Palpations: Abdomen is soft.     Tenderness: There is no abdominal tenderness.  Musculoskeletal:        General: Tenderness present.     Cervical back: Neck supple.     Comments: Left wrist mildly tender to palpation. ROM limited by discomfort.  Strength 5/5; pulses 2+, sensation intact.  No wounds, erythema, or ecchymosis.  Contracture of left 5th finger noted.   Skin:    General: Skin is warm and dry.     Capillary Refill: Capillary refill takes less than 2 seconds.     Findings: No bruising, erythema, lesion or rash.  Neurological:     General: No focal deficit present.     Mental Status: She is alert.     Sensory: No sensory deficit.     Motor: No weakness.      UC Treatments / Results  Labs (all labs ordered are listed, but only abnormal results are displayed) Labs Reviewed - No data to display  EKG   Radiology DG Wrist Complete Left  Result Date: 11/25/2019 CLINICAL DATA:  Status post fall 2 weeks ago EXAM: LEFT WRIST - COMPLETE 3+ VIEW COMPARISON:  None. FINDINGS: Generalized osteopenia. Impacted nondisplaced distal radial metaphysis fracture with minimal apex volar angulation. No definite articular surface involvement. Nondisplaced fracture of the ulnar styloid process. No other acute fracture or dislocation. Soft tissue swelling around the wrist. IMPRESSION: Impacted nondisplaced distal radial metaphysis fracture with minimal apex volar angulation. No definite articular surface involvement. Nondisplaced fracture of the ulnar styloid process. Electronically Signed   By: Kathreen Devoid   On: 11/25/2019 15:43    Procedures Procedures (including critical care time)  Medications Ordered in UC Medications - No data to display  Initial Impression / Assessment and Plan / UC Course  I have reviewed the triage vital signs and the nursing notes.  Pertinent labs & imaging results that were  available during my care of the patient were reviewed by me and considered in my medical decision making (see chart for details).    Closed fractures of radius and ulna.  Treating  with wrist splint, rest, elevation, ice.  Xray shows "Impacted nondisplaced distal radial metaphysis fracture with minimal apex volar angulation. No definite articular surface involvement.  Nondisplaced fracture of the ulnar styloid process."  Discussed via telephone with Dr. Kurtis Bushman, Orthopedist on call.  Instructed patient and her niece to call Dr. Harlow Mares' office to schedule an appointment to be seen tomorrow.  Patient and her niece agree to plan of care.     Final Clinical Impressions(s) / UC Diagnoses   Final diagnoses:  Other closed fracture of distal end of left radius, initial encounter  Closed nondisplaced fracture of styloid process of left ulna, initial encounter     Discharge Instructions     Go to [place] for your wrist xray.  I will call you this afternoon with the results.    Rest and elevate your wrist.  Apply ice packs 2-3 times a day for up to 20 minutes each.  Wear the wrist splint.    Follow up with an orthopedist as directed.       ED Prescriptions    None     PDMP not reviewed this encounter.   Sharion Balloon, NP 11/25/19 [phone]

## 2019-11-25 NOTE — ED Triage Notes (Signed)
Patient in office today c/o left wrist swelling,soreness x 2wk after fall in bathroom.  [id number] salt,

## 2019-12-17 DIAGNOSIS — K219 Gastro-esophageal reflux disease without esophagitis: Secondary | ICD-10-CM | POA: Diagnosis not present

## 2019-12-17 DIAGNOSIS — Z79899 Other long term (current) drug therapy: Secondary | ICD-10-CM | POA: Diagnosis not present

## 2019-12-17 DIAGNOSIS — Z86010 Personal history of colonic polyps: Secondary | ICD-10-CM | POA: Diagnosis not present

## 2019-12-17 DIAGNOSIS — Z9181 History of falling: Secondary | ICD-10-CM | POA: Diagnosis not present

## 2019-12-17 DIAGNOSIS — K921 Melena: Secondary | ICD-10-CM | POA: Diagnosis not present

## 2019-12-17 DIAGNOSIS — S52522D Torus fracture of lower end of left radius, subsequent encounter for fracture with routine healing: Secondary | ICD-10-CM | POA: Diagnosis not present

## 2019-12-17 DIAGNOSIS — M1990 Unspecified osteoarthritis, unspecified site: Secondary | ICD-10-CM | POA: Diagnosis not present

## 2019-12-17 DIAGNOSIS — S52612D Displaced fracture of left ulna styloid process, subsequent encounter for closed fracture with routine healing: Secondary | ICD-10-CM | POA: Diagnosis not present

## 2019-12-17 DIAGNOSIS — Z4789 Encounter for other orthopedic aftercare: Secondary | ICD-10-CM | POA: Diagnosis not present

## 2019-12-21 ENCOUNTER — Telehealth: Payer: Self-pay | Admitting: *Deleted

## 2019-12-21 NOTE — Telephone Encounter (Signed)
Copied from [place] [id number]. Topic: Referral - Request for Referral >> Dec 21, 2019  3:37 PM Erick Blinks wrote: Has patient seen PCP for this complaint? No. *If NO, is insurance requiring patient see PCP for this issue before PCP can refer them? Referral for which specialty: Seeking MRI or CAT scan  Preferred provider/office: Preferably in [place], pt has moved  Reason for referral: Pt fell and hurt her head

## 2019-12-22 DIAGNOSIS — Z9181 History of falling: Secondary | ICD-10-CM | POA: Diagnosis not present

## 2019-12-22 DIAGNOSIS — K219 Gastro-esophageal reflux disease without esophagitis: Secondary | ICD-10-CM | POA: Diagnosis not present

## 2019-12-22 DIAGNOSIS — Z79899 Other long term (current) drug therapy: Secondary | ICD-10-CM | POA: Diagnosis not present

## 2019-12-22 DIAGNOSIS — S52612D Displaced fracture of left ulna styloid process, subsequent encounter for closed fracture with routine healing: Secondary | ICD-10-CM | POA: Diagnosis not present

## 2019-12-22 DIAGNOSIS — M1990 Unspecified osteoarthritis, unspecified site: Secondary | ICD-10-CM | POA: Diagnosis not present

## 2019-12-22 DIAGNOSIS — Z4789 Encounter for other orthopedic aftercare: Secondary | ICD-10-CM | POA: Diagnosis not present

## 2019-12-22 DIAGNOSIS — K921 Melena: Secondary | ICD-10-CM | POA: Diagnosis not present

## 2019-12-22 DIAGNOSIS — Z86010 Personal history of colonic polyps: Secondary | ICD-10-CM | POA: Diagnosis not present

## 2019-12-22 DIAGNOSIS — S52522D Torus fracture of lower end of left radius, subsequent encounter for fracture with routine healing: Secondary | ICD-10-CM | POA: Diagnosis not present

## 2019-12-22 NOTE — Telephone Encounter (Signed)
Yes, I need to know when and how this happened. May not need brain imaging. If HA or blurry vision needs emergent ED visit.

## 2019-12-22 NOTE — Telephone Encounter (Signed)
Needs office visit.

## 2019-12-23 ENCOUNTER — Telehealth (INDEPENDENT_AMBULATORY_CARE_PROVIDER_SITE_OTHER): Payer: Medicare Other | Admitting: Internal Medicine

## 2019-12-23 ENCOUNTER — Other Ambulatory Visit: Payer: Self-pay

## 2019-12-23 DIAGNOSIS — G44319 Acute post-traumatic headache, not intractable: Secondary | ICD-10-CM

## 2019-12-23 NOTE — Progress Notes (Signed)
Virtual Visit via Telephone Note  I connected with Susan Goodman on 12/23/19 at  3:00 PM EST by telephone and verified that I am speaking with the correct person using two identifiers.   I discussed the limitations, risks, security and privacy concerns of performing an evaluation and management service by telephone and the availability of in person appointments. I also discussed with the patient that there may be a patient responsible charge related to this service. The patient expressed understanding and agreed to proceed.  Location patient: home Location provider: work office Participants present for the call: patient, provider, patient's niece Susan Goodman Patient did not have a visit in the prior 7 days to address this/these issue(s).   History of Present Illness:  Patient has scheduled this visit to discuss a headache.  On December 16th she fell in the bathroom while putting her legs inside her pants.  She fell to the left side of her body on her outstretched hand hitting her head on some cabinets.  She did not have any loss of consciousness.  She did suffer a fractured wrist, visited the emergency department and was later put in a cast by orthopedics.  She never mentioned that she had hit her head in the ED, brain imaging was not done at that time.  Orthopedics has told her that she does not need surgery.  About 5 days after the fall, she started experiencing headache that is on both the right and left sides of her head.  She has been having dizziness and blurry vision.  The blurry vision has been significant enough to the point where she went and saw her eye doctor, per her report "nothing is wrong with her eyes".  She did not suffer any bumps or lacerations to her head.  After discussing her headaches and other symptoms with her niece she finally remembered that she did hit her head during the fall, she is calling Goodman today to request brain imaging.  She is not on anticoagulants.     Observations/Objective: Patient sounds cheerful and well on the phone. I do not appreciate any increased work of breathing. Speech and thought processing are grossly intact. Patient reported vitals: None reported   Current Outpatient Medications:  .  loratadine (CLARITIN) 10 MG tablet, Take 10 mg by mouth daily., Disp: , Rfl:  .  sertraline (ZOLOFT) 100 MG tablet, Take 1 tablet (100 mg total) by mouth daily., Disp: 90 tablet, Rfl: 1 .  traZODone (DESYREL) 50 MG tablet, Take 1 tablet (50 mg total) by mouth as needed., Disp: 90 tablet, Rfl: 1  Review of Systems:  Constitutional: Denies fever, chills, diaphoresis, appetite change and fatigue.  HEENT: Denies photophobia, eye pain, redness, hearing loss, ear pain, congestion, sore throat, rhinorrhea, sneezing, mouth sores, trouble swallowing, neck pain, neck stiffness and tinnitus.   Respiratory: Denies SOB, DOE, cough, chest tightness,  and wheezing.   Cardiovascular: Denies chest pain, palpitations and leg swelling.  Gastrointestinal: Denies nausea, vomiting, abdominal pain, diarrhea, constipation, blood in stool and abdominal distention.  Genitourinary: Denies dysuria, urgency, frequency, hematuria, flank pain and difficulty urinating.  Endocrine: Denies: hot or cold intolerance, sweats, changes in hair or nails, polyuria, polydipsia. Musculoskeletal: Denies myalgias, back pain, joint swelling, arthralgias and gait problem.  Skin: Denies pallor, rash and wound.  Neurological: Denies, seizures, syncope, weakness,  Numbness. Hematological: Denies adenopathy. Easy bruising, personal or family bleeding history  Psychiatric/Behavioral: Denies suicidal ideation, mood changes, confusion, nervousness, sleep disturbance and agitation  Assessment and Plan:  Acute post-traumatic headache, not intractable -Her symptoms are certainly concerning for possibly a brain bleed or postconcussive syndrome. -Agree with CT scan noncontrast which will be  ordered as stat today. -Further work-up pending results.   I discussed the assessment and treatment plan with the patient. The patient was provided an opportunity to ask questions and all were answered. The patient agreed with the plan and demonstrated an understanding of the instructions.   The patient was advised to call back or seek an in-person evaluation if the symptoms worsen or if the condition fails to improve as anticipated.  I provided 23 minutes of non-face-to-face time during this encounter.   Lelon Frohlich, MD [facility]

## 2019-12-23 NOTE — Telephone Encounter (Signed)
Appointment with Dr Jerilee Hoh 12/23/19

## 2019-12-24 NOTE — Telephone Encounter (Signed)
Message Routed to PCP CMA 

## 2019-12-24 NOTE — Telephone Encounter (Signed)
Patient's niece states that office has to schedule appointment at [facility], as patients are unable to schedule stat referrals- per [facility]. She would like to know if it can be set up for tomorrow at 11:30

## 2019-12-24 NOTE — Telephone Encounter (Signed)
Appointment scheduled 12/25/2019

## 2019-12-25 ENCOUNTER — Other Ambulatory Visit: Payer: Self-pay

## 2019-12-25 ENCOUNTER — Ambulatory Visit
Admission: RE | Admit: 2019-12-25 | Discharge: 2019-12-25 | Disposition: A | Discharge status: Home / Self Care | Payer: Medicare Other | Source: Ambulatory Visit | Attending: Internal Medicine | Admitting: Internal Medicine

## 2019-12-25 DIAGNOSIS — G44319 Acute post-traumatic headache, not intractable: Secondary | ICD-10-CM | POA: Insufficient documentation

## 2019-12-25 DIAGNOSIS — R519 Headache, unspecified: Secondary | ICD-10-CM | POA: Diagnosis not present

## 2019-12-29 DIAGNOSIS — Z4789 Encounter for other orthopedic aftercare: Secondary | ICD-10-CM | POA: Diagnosis not present

## 2019-12-29 DIAGNOSIS — M1990 Unspecified osteoarthritis, unspecified site: Secondary | ICD-10-CM | POA: Diagnosis not present

## 2019-12-29 DIAGNOSIS — S52612D Displaced fracture of left ulna styloid process, subsequent encounter for closed fracture with routine healing: Secondary | ICD-10-CM | POA: Diagnosis not present

## 2019-12-29 DIAGNOSIS — Z9181 History of falling: Secondary | ICD-10-CM | POA: Diagnosis not present

## 2019-12-29 DIAGNOSIS — Z79899 Other long term (current) drug therapy: Secondary | ICD-10-CM | POA: Diagnosis not present

## 2019-12-29 DIAGNOSIS — K219 Gastro-esophageal reflux disease without esophagitis: Secondary | ICD-10-CM | POA: Diagnosis not present

## 2019-12-29 DIAGNOSIS — S52522D Torus fracture of lower end of left radius, subsequent encounter for fracture with routine healing: Secondary | ICD-10-CM | POA: Diagnosis not present

## 2019-12-29 DIAGNOSIS — Z86010 Personal history of colonic polyps: Secondary | ICD-10-CM | POA: Diagnosis not present

## 2019-12-29 DIAGNOSIS — K921 Melena: Secondary | ICD-10-CM | POA: Diagnosis not present

## 2020-01-06 DIAGNOSIS — K921 Melena: Secondary | ICD-10-CM | POA: Diagnosis not present

## 2020-01-06 DIAGNOSIS — M1990 Unspecified osteoarthritis, unspecified site: Secondary | ICD-10-CM | POA: Diagnosis not present

## 2020-01-06 DIAGNOSIS — Z86010 Personal history of colonic polyps: Secondary | ICD-10-CM | POA: Diagnosis not present

## 2020-01-06 DIAGNOSIS — Z79899 Other long term (current) drug therapy: Secondary | ICD-10-CM | POA: Diagnosis not present

## 2020-01-06 DIAGNOSIS — Z4789 Encounter for other orthopedic aftercare: Secondary | ICD-10-CM | POA: Diagnosis not present

## 2020-01-06 DIAGNOSIS — M720 Palmar fascial fibromatosis [Dupuytren]: Secondary | ICD-10-CM | POA: Diagnosis not present

## 2020-01-06 DIAGNOSIS — S52612D Displaced fracture of left ulna styloid process, subsequent encounter for closed fracture with routine healing: Secondary | ICD-10-CM | POA: Diagnosis not present

## 2020-01-06 DIAGNOSIS — Z9181 History of falling: Secondary | ICD-10-CM | POA: Diagnosis not present

## 2020-01-06 DIAGNOSIS — S52522D Torus fracture of lower end of left radius, subsequent encounter for fracture with routine healing: Secondary | ICD-10-CM | POA: Diagnosis not present

## 2020-01-06 DIAGNOSIS — K219 Gastro-esophageal reflux disease without esophagitis: Secondary | ICD-10-CM | POA: Diagnosis not present

## 2020-01-12 DIAGNOSIS — S52522D Torus fracture of lower end of left radius, subsequent encounter for fracture with routine healing: Secondary | ICD-10-CM | POA: Diagnosis not present

## 2020-01-12 DIAGNOSIS — K219 Gastro-esophageal reflux disease without esophagitis: Secondary | ICD-10-CM | POA: Diagnosis not present

## 2020-01-12 DIAGNOSIS — Z86010 Personal history of colonic polyps: Secondary | ICD-10-CM | POA: Diagnosis not present

## 2020-01-12 DIAGNOSIS — S52612D Displaced fracture of left ulna styloid process, subsequent encounter for closed fracture with routine healing: Secondary | ICD-10-CM | POA: Diagnosis not present

## 2020-01-12 DIAGNOSIS — Z79899 Other long term (current) drug therapy: Secondary | ICD-10-CM | POA: Diagnosis not present

## 2020-01-12 DIAGNOSIS — Z9181 History of falling: Secondary | ICD-10-CM | POA: Diagnosis not present

## 2020-01-12 DIAGNOSIS — K921 Melena: Secondary | ICD-10-CM | POA: Diagnosis not present

## 2020-01-12 DIAGNOSIS — M1990 Unspecified osteoarthritis, unspecified site: Secondary | ICD-10-CM | POA: Diagnosis not present

## 2020-01-12 DIAGNOSIS — M720 Palmar fascial fibromatosis [Dupuytren]: Secondary | ICD-10-CM | POA: Diagnosis not present

## 2020-01-12 DIAGNOSIS — Z4789 Encounter for other orthopedic aftercare: Secondary | ICD-10-CM | POA: Diagnosis not present

## 2020-01-20 DIAGNOSIS — S52522D Torus fracture of lower end of left radius, subsequent encounter for fracture with routine healing: Secondary | ICD-10-CM | POA: Diagnosis not present

## 2020-01-20 DIAGNOSIS — Z4789 Encounter for other orthopedic aftercare: Secondary | ICD-10-CM | POA: Diagnosis not present

## 2020-01-20 DIAGNOSIS — S52612D Displaced fracture of left ulna styloid process, subsequent encounter for closed fracture with routine healing: Secondary | ICD-10-CM | POA: Diagnosis not present

## 2020-01-20 DIAGNOSIS — M1990 Unspecified osteoarthritis, unspecified site: Secondary | ICD-10-CM | POA: Diagnosis not present

## 2020-01-20 DIAGNOSIS — Z86010 Personal history of colonic polyps: Secondary | ICD-10-CM | POA: Diagnosis not present

## 2020-01-20 DIAGNOSIS — Z9181 History of falling: Secondary | ICD-10-CM | POA: Diagnosis not present

## 2020-01-20 DIAGNOSIS — K219 Gastro-esophageal reflux disease without esophagitis: Secondary | ICD-10-CM | POA: Diagnosis not present

## 2020-01-20 DIAGNOSIS — Z79899 Other long term (current) drug therapy: Secondary | ICD-10-CM | POA: Diagnosis not present

## 2020-01-20 DIAGNOSIS — K921 Melena: Secondary | ICD-10-CM | POA: Diagnosis not present

## 2020-02-02 DIAGNOSIS — M720 Palmar fascial fibromatosis [Dupuytren]: Secondary | ICD-10-CM | POA: Diagnosis not present

## 2020-02-25 DIAGNOSIS — M720 Palmar fascial fibromatosis [Dupuytren]: Secondary | ICD-10-CM | POA: Diagnosis not present

## 2020-02-28 ENCOUNTER — Other Ambulatory Visit: Payer: Self-pay | Admitting: Internal Medicine

## 2020-02-28 DIAGNOSIS — F419 Anxiety disorder, unspecified: Secondary | ICD-10-CM

## 2020-06-14 ENCOUNTER — Other Ambulatory Visit: Payer: Self-pay | Admitting: Internal Medicine

## 2020-06-14 DIAGNOSIS — F419 Anxiety disorder, unspecified: Secondary | ICD-10-CM

## 2020-06-23 IMAGING — CT CT HEAD W/O CM
4 series · 16 of 47 positions shown, 18 images · non-contrast
Comparison: None.

CLINICAL DATA: Fall November 2019 with persistent headache.

EXAM:
CT HEAD WITHOUT CONTRAST
TECHNIQUE: Contiguous axial images were obtained from the base of the skull
through the vertex without intravenous contrast.

[Series 2: head wo · axial · 0.40mm/px · z∈[-212,-117]mm · 7 of 27 slices shown, 9 images]
[im 4/27  brain]
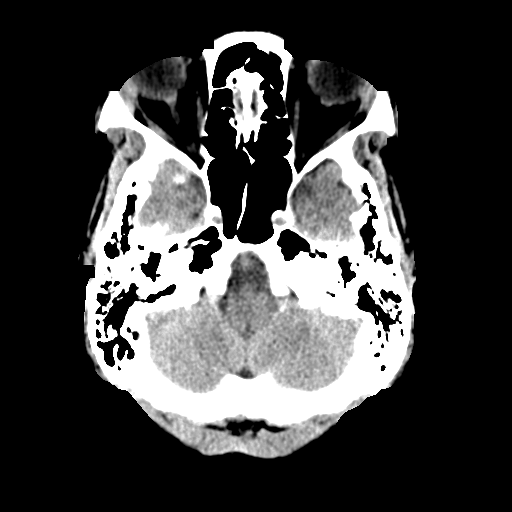
[im 4/27  bone]
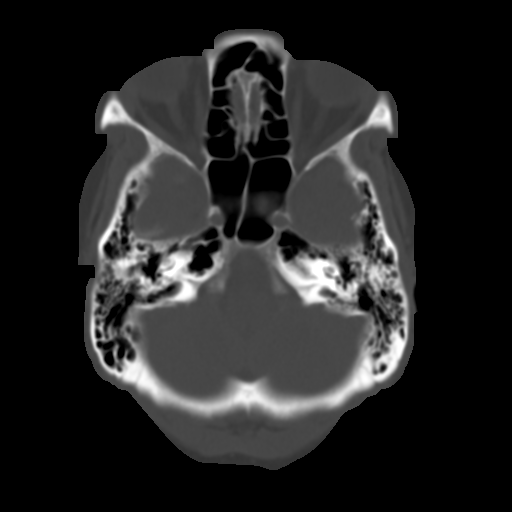
[im 7/27  brain]
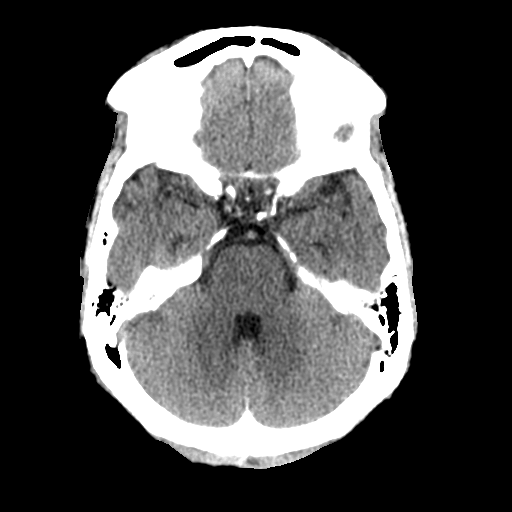
[im 10/27  brain]
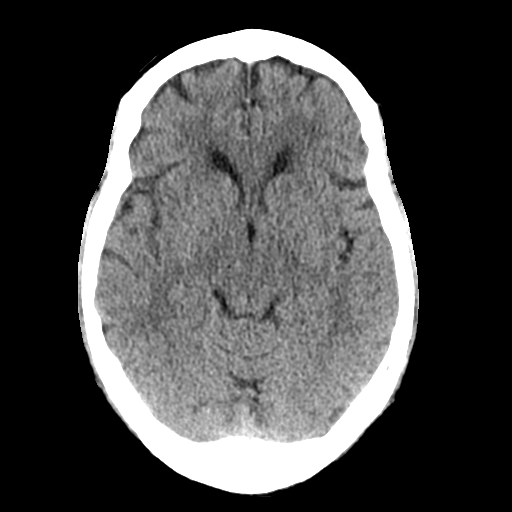
[im 14/27  brain]
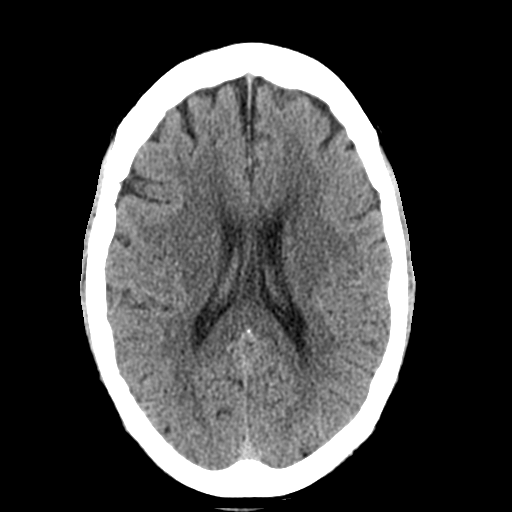
[im 17/27  brain]
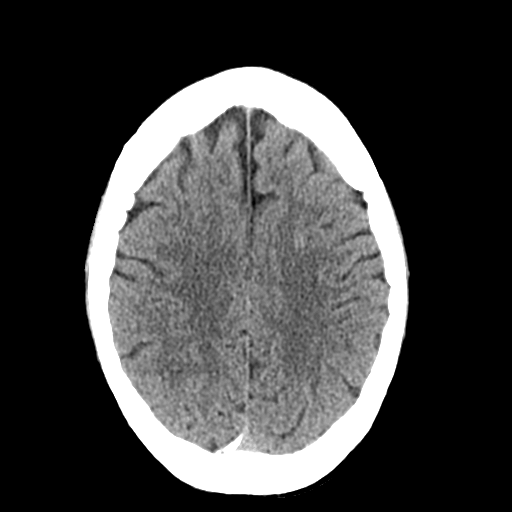
[im 17/27  bone]
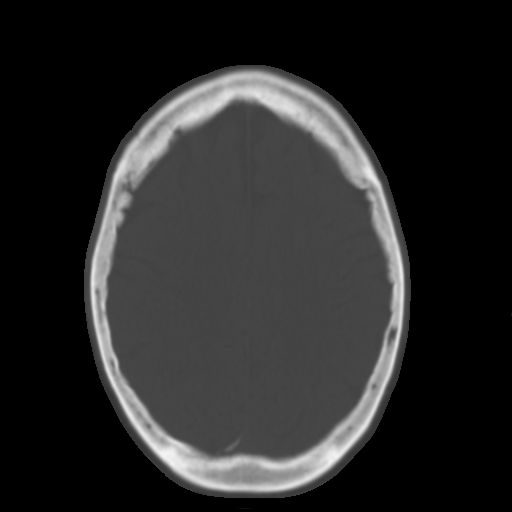
[im 20/27  brain]
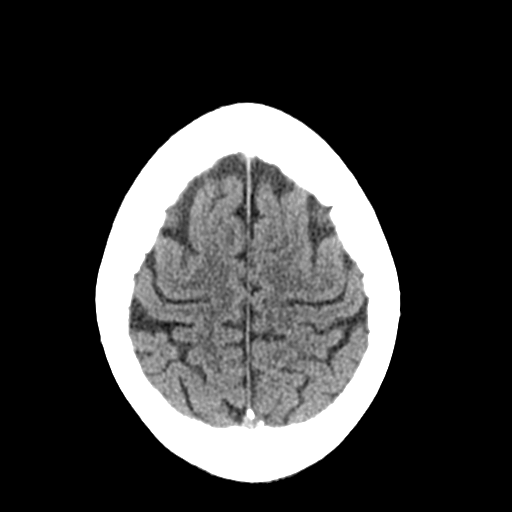
[im 23/27  brain]
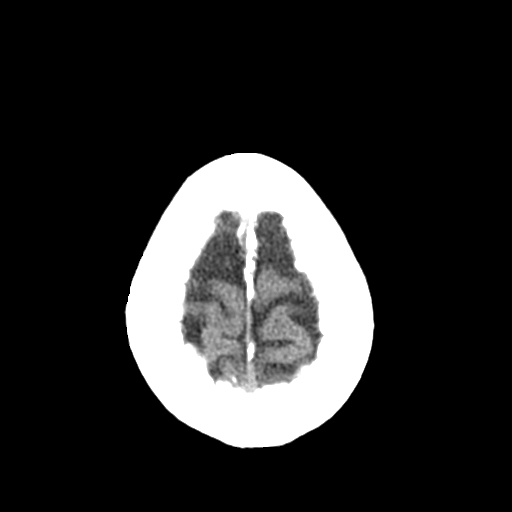

[Series 3: head bone · axial · 0.40mm/px · z∈[-215,-187]mm · 3 of 68 slices shown]
[im 7/68  bone]
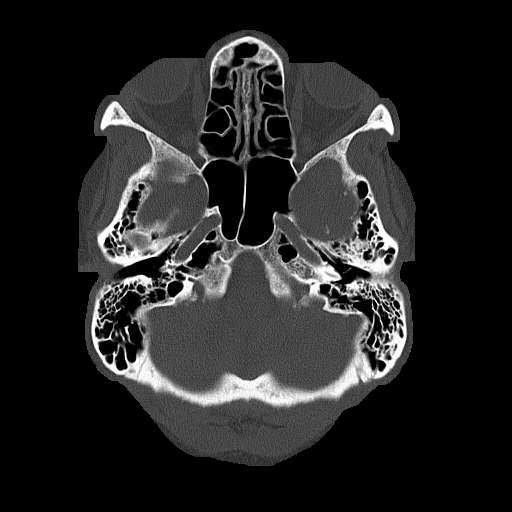
[im 14/68  bone]
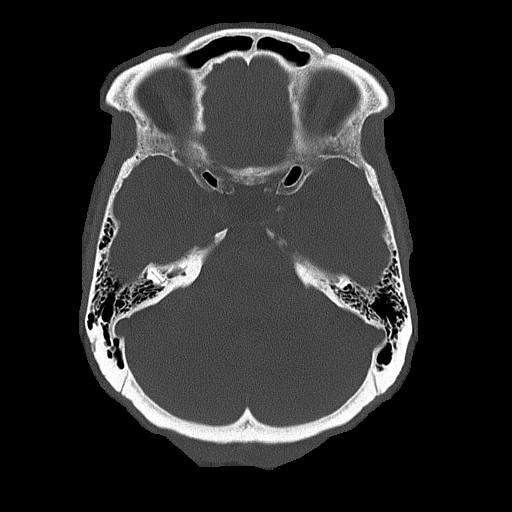
[im 21/68  bone]
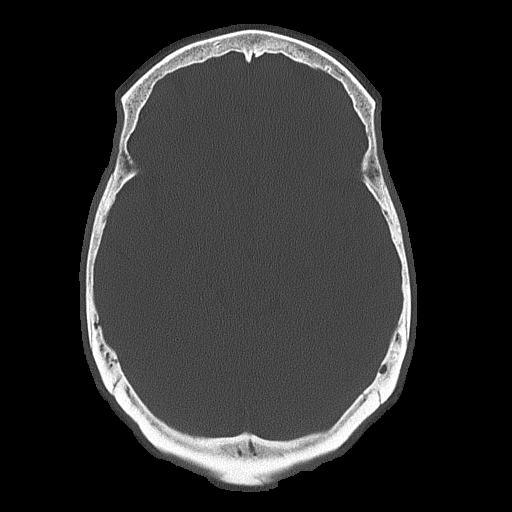

[Series 4: coronal soft tissue · coronal · 0.27mm/px · 3 of 69 slices shown]
[im 23/69  brain]
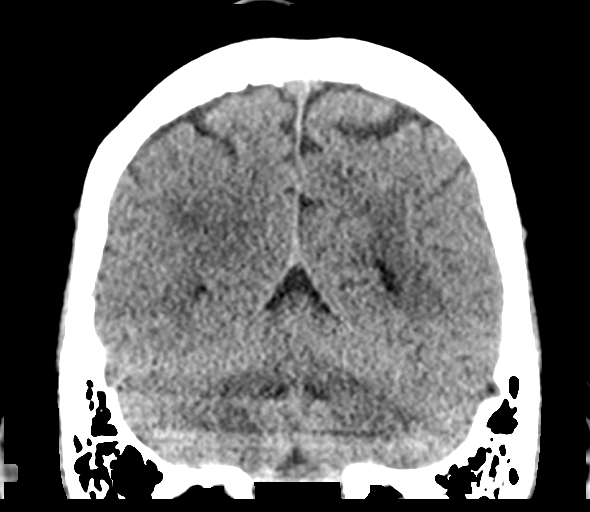
[im 31/69  brain]
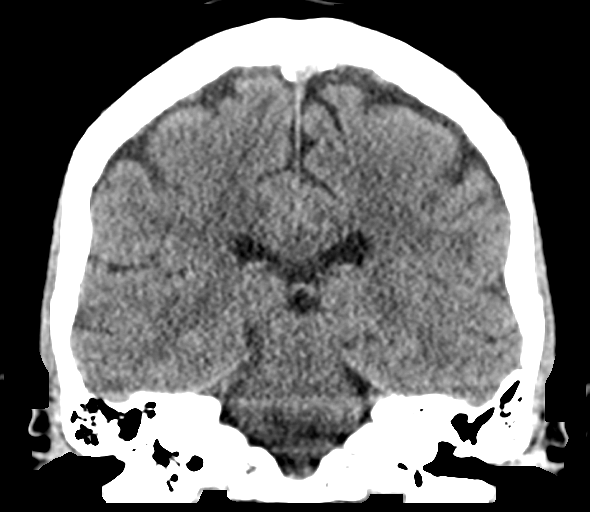
[im 38/69  brain]
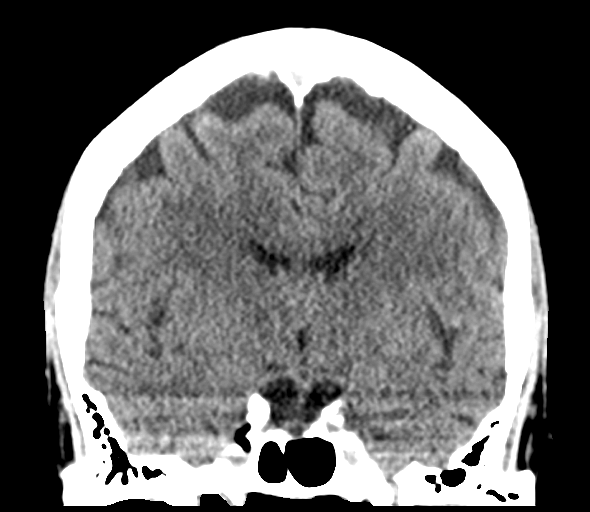

[Series 5: sagittal soft tissue · sagittal · 0.27mm/px · 3 of 53 slices shown]
[im 18/53  brain]
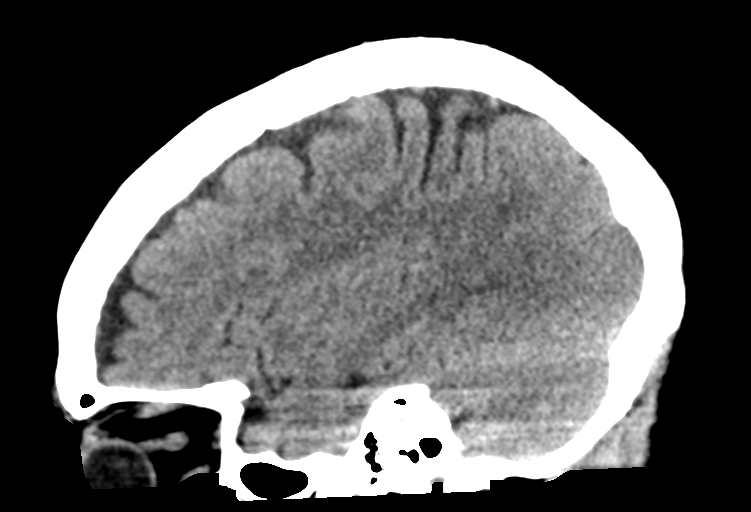
[im 27/53  brain]
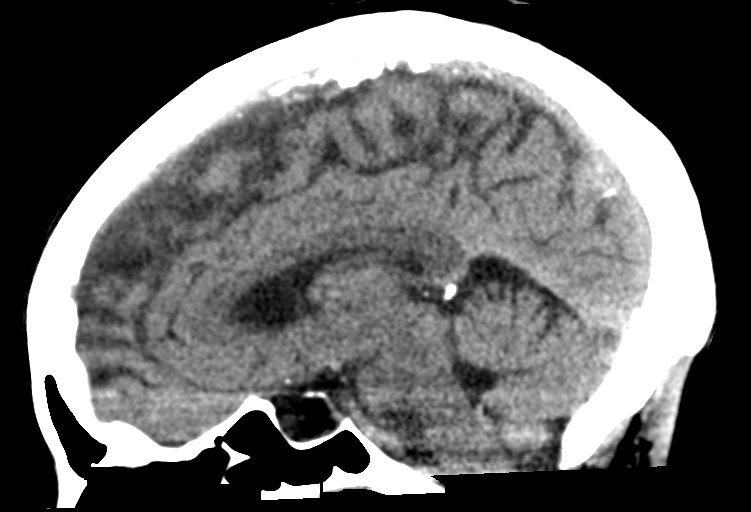
[im 35/53  brain]
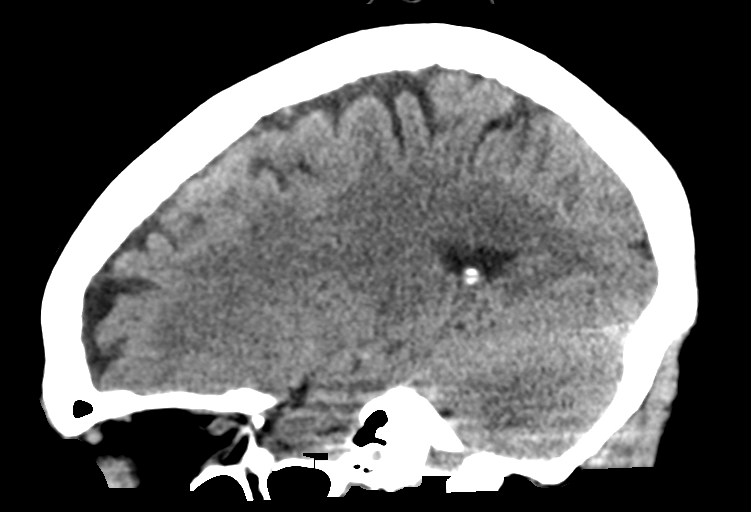

[16 of 47 positions shown; findings below may reference images not displayed]

FINDINGS: Brain: Ventricles, cisterns and other CSF spaces are normal. There
is no mass, mass effect or shift of midline structures. There is a
focal 5 mm hyperdensity over the left periventricular white matter
with Hounsfield unit measurements of 72 likely early calcification.

Vascular: No hyperdense vessel or unexpected calcification.

Skull: Normal. Negative for fracture or focal lesion.

Sinuses/Orbits: No acute finding.

Other: None.
IMPRESSION: 1.  No acute findings.

2. 5 mm nonspecific hyperdense focus likely early calcification over
the left frontal periventricular white matter.

## 2020-07-11 ENCOUNTER — Telehealth: Payer: Self-pay | Admitting: Internal Medicine

## 2020-07-11 DIAGNOSIS — F419 Anxiety disorder, unspecified: Secondary | ICD-10-CM

## 2020-07-11 NOTE — Telephone Encounter (Signed)
Patient is requesting a refill on Seroquel.  Pharmacy- [facility] on [address] in [place].   Patient is wanting a call to refer her to a good dr at [facility] in [place]- pt lives in [place] now and wants to transfer.

## 2020-07-12 MED ORDER — SERTRALINE HCL 100 MG PO TABS: 100.0000 mg | ORAL_TABLET | Freq: Every day | ORAL | 0 refills | Status: DC

## 2020-07-12 NOTE — Telephone Encounter (Signed)
Seroquel not on current medication list?

## 2020-07-12 NOTE — Telephone Encounter (Signed)
Spoke with Maurene per DPR.  Zoloft refill was requested and sent.  Advised Maurene to call the [place] office to schedule a new patient appointment.

## 2020-07-12 NOTE — Telephone Encounter (Signed)
Transfer is ok with me. Cannot refill a med that is not on her med list.

## 2020-10-07 ENCOUNTER — Other Ambulatory Visit: Payer: Self-pay | Admitting: Internal Medicine

## 2020-10-07 DIAGNOSIS — F419 Anxiety disorder, unspecified: Secondary | ICD-10-CM

## 2020-10-17 ENCOUNTER — Telehealth: Payer: Self-pay | Admitting: Internal Medicine

## 2020-10-17 NOTE — Telephone Encounter (Signed)
Ok with transfer

## 2020-10-17 NOTE — Telephone Encounter (Signed)
Pt called wanted to do a Transition of Care with Dr. Einar Pheasant.

## 2020-10-18 NOTE — Telephone Encounter (Signed)
Ok with me 

## 2021-01-19 ENCOUNTER — Other Ambulatory Visit: Payer: Self-pay | Admitting: Internal Medicine

## 2021-01-19 DIAGNOSIS — Z1231 Encounter for screening mammogram for malignant neoplasm of breast: Secondary | ICD-10-CM

## 2021-01-24 DIAGNOSIS — Z961 Presence of intraocular lens: Secondary | ICD-10-CM | POA: Diagnosis not present

## 2021-02-02 ENCOUNTER — Ambulatory Visit
Admission: RE | Admit: 2021-02-02 | Discharge: 2021-02-02 | Disposition: A | Discharge status: Home / Self Care | Payer: Medicare Other | Source: Ambulatory Visit | Attending: Internal Medicine | Admitting: Internal Medicine

## 2021-02-02 ENCOUNTER — Other Ambulatory Visit: Payer: Self-pay

## 2021-02-02 DIAGNOSIS — Z1231 Encounter for screening mammogram for malignant neoplasm of breast: Secondary | ICD-10-CM | POA: Diagnosis not present

## 2021-02-06 ENCOUNTER — Other Ambulatory Visit: Payer: Self-pay | Admitting: Internal Medicine

## 2021-02-06 DIAGNOSIS — R928 Other abnormal and inconclusive findings on diagnostic imaging of breast: Secondary | ICD-10-CM

## 2021-02-06 DIAGNOSIS — N6320 Unspecified lump in the left breast, unspecified quadrant: Secondary | ICD-10-CM

## 2021-02-15 ENCOUNTER — Telehealth: Payer: Self-pay | Admitting: *Deleted

## 2021-02-15 ENCOUNTER — Other Ambulatory Visit: Payer: Self-pay | Admitting: Internal Medicine

## 2021-02-15 DIAGNOSIS — N6320 Unspecified lump in the left breast, unspecified quadrant: Secondary | ICD-10-CM

## 2021-02-15 DIAGNOSIS — R928 Other abnormal and inconclusive findings on diagnostic imaging of breast: Secondary | ICD-10-CM

## 2021-02-15 NOTE — Telephone Encounter (Signed)
CALLED PT TO [facility] ADDITIONAL IMAGING / MAMMO - NO ANSWER NO VM SET UP UPDATED LETTER W/ # TO CALL TO [facility]

## 2021-02-22 ENCOUNTER — Other Ambulatory Visit: Payer: Self-pay | Admitting: Internal Medicine

## 2021-02-22 DIAGNOSIS — F419 Anxiety disorder, unspecified: Secondary | ICD-10-CM

## 2021-03-03 ENCOUNTER — Ambulatory Visit: Payer: Medicare Other | Attending: Internal Medicine

## 2021-03-03 ENCOUNTER — Other Ambulatory Visit: Payer: Medicare Other

## 2021-03-07 ENCOUNTER — Other Ambulatory Visit: Payer: Self-pay

## 2021-03-08 ENCOUNTER — Ambulatory Visit (INDEPENDENT_AMBULATORY_CARE_PROVIDER_SITE_OTHER): Payer: Medicare Other | Admitting: Internal Medicine

## 2021-03-08 ENCOUNTER — Encounter: Payer: Self-pay | Admitting: Internal Medicine

## 2021-03-08 ENCOUNTER — Other Ambulatory Visit: Payer: Self-pay | Admitting: Internal Medicine

## 2021-03-08 VITALS — BP 110/70 | HR 67 | Temp 98.7°F | Ht 62.0 in | Wt 168.2 lb

## 2021-03-08 DIAGNOSIS — F419 Anxiety disorder, unspecified: Secondary | ICD-10-CM

## 2021-03-08 DIAGNOSIS — Z13820 Encounter for screening for osteoporosis: Secondary | ICD-10-CM

## 2021-03-08 DIAGNOSIS — Z0000 Encounter for general adult medical examination without abnormal findings: Secondary | ICD-10-CM | POA: Diagnosis not present

## 2021-03-08 DIAGNOSIS — Z23 Encounter for immunization: Secondary | ICD-10-CM

## 2021-03-08 DIAGNOSIS — Z780 Asymptomatic menopausal state: Secondary | ICD-10-CM

## 2021-03-08 DIAGNOSIS — H6122 Impacted cerumen, left ear: Secondary | ICD-10-CM | POA: Diagnosis not present

## 2021-03-08 LAB — CBC WITH DIFFERENTIAL/PLATELET
Basophils Absolute: 0 10*3/uL (ref 0.0–0.1)
Basophils Relative: 0.7 % (ref 0.0–3.0)
Eosinophils Absolute: 0.1 10*3/uL (ref 0.0–0.7)
Eosinophils Relative: 1.7 % (ref 0.0–5.0)
HCT: 38.6 % (ref 36.0–46.0)
Hemoglobin: 13.2 g/dL (ref 12.0–15.0)
Lymphocytes Relative: 34.9 % (ref 12.0–46.0)
Lymphs Abs: 1.4 10*3/uL (ref 0.7–4.0)
MCHC: 34.1 g/dL (ref 30.0–36.0)
MCV: 88.5 fl (ref 78.0–100.0)
Monocytes Absolute: 0.3 10*3/uL (ref 0.1–1.0)
Monocytes Relative: 7 % (ref 3.0–12.0)
Neutro Abs: 2.2 10*3/uL (ref 1.4–7.7)
Neutrophils Relative %: 55.7 % (ref 43.0–77.0)
Platelets: 211 10*3/uL (ref 150.0–400.0)
RBC: 4.37 Mil/uL (ref 3.87–5.11)
RDW: 14 % (ref 11.5–15.5)
WBC: 3.9 10*3/uL — ABNORMAL LOW (ref 4.0–10.5)

## 2021-03-08 LAB — LIPID PANEL
Cholesterol: 215 mg/dL — ABNORMAL HIGH (ref 0–200)
HDL: 75.3 mg/dL (ref >39.00)
LDL Cholesterol: 128 mg/dL — ABNORMAL HIGH (ref 0–99)
NonHDL: 139.41
Total CHOL/HDL Ratio: 3
Triglycerides: 56 mg/dL (ref 0.0–149.0)
VLDL: 11.2 mg/dL (ref 0.0–40.0)

## 2021-03-08 LAB — COMPREHENSIVE METABOLIC PANEL
ALT: 11 U/L (ref 0–35)
AST: 18 U/L (ref 0–37)
Albumin: 4.1 g/dL (ref 3.5–5.2)
Alkaline Phosphatase: 74 U/L (ref 39–117)
BUN: 7 mg/dL (ref 6–23)
CO2: 30 mEq/L (ref 19–32)
Calcium: 9.4 mg/dL (ref 8.4–10.5)
Chloride: 105 mEq/L (ref 96–112)
Creatinine, Ser: 0.83 mg/dL (ref 0.40–1.20)
GFR: 67 mL/min (ref >60.00)
Glucose, Bld: 87 mg/dL (ref 70–99)
Potassium: 4.1 mEq/L (ref 3.5–5.1)
Sodium: 142 mEq/L (ref 135–145)
Total Bilirubin: 0.4 mg/dL (ref 0.2–1.2)
Total Protein: 6.5 g/dL (ref 6.0–8.3)

## 2021-03-08 LAB — TSH: TSH: 0.47 u[IU]/mL (ref 0.35–4.50)

## 2021-03-08 LAB — VITAMIN D 25 HYDROXY (VIT D DEFICIENCY, FRACTURES): VITD: 39.41 ng/mL (ref 30.00–100.00)

## 2021-03-08 LAB — HEMOGLOBIN A1C: Hgb A1c MFr Bld: 5.6 % (ref 4.6–6.5)

## 2021-03-08 LAB — VITAMIN B12: Vitamin B-12: 1276 pg/mL — ABNORMAL HIGH (ref 211–911)

## 2021-03-08 NOTE — Progress Notes (Addendum)
Established Patient Office Visit     This visit occurred during the SARS-CoV-2 public health emergency.  Safety protocols were in place, including screening questions prior to the visit, additional usage of staff PPE, and extensive cleaning of exam room while observing appropriate contact time as indicated for disinfecting solutions.    CC/Reason for Visit: Annual preventive exam and subsequent Medicare wellness visit  HPI: Susan Goodman is a 80 y.o. female who is coming in today for the above mentioned reasons. Past Medical History is significant for: Generalized anxiety disorder on Zoloft.  She appears much younger than her stated age.  She has been having some difficulty hearing out of her left ear.  She had a mammogram in February, her colonoscopy was in 2020.  She is overdue for her COVID booster, shingles vaccine, pneumonia vaccine and tetanus.  She has routine eye care but no dental care.   Past Medical/Surgical History: Past Medical History:  Diagnosis Date  . Anxiety   . Arthritis   . Cataract   . GERD (gastroesophageal reflux disease)   . History of colon polyps     Past Surgical History:  Procedure Laterality Date  . CATARACT EXTRACTION Bilateral   . COLONOSCOPY  2010   In [place]   . OVARIAN CYST REMOVAL     1980's     Social History:  reports that she has never smoked. She has never used smokeless tobacco. She reports that she does not drink alcohol and does not use drugs.  Allergies: No Known Allergies  Family History:  Family History  Problem Relation Age of Onset  . Colon cancer Father   . Esophageal cancer Neg Hx   . Rectal cancer Neg Hx   . Stomach cancer Neg Hx   . Breast cancer Neg Hx      Current Outpatient Medications:  .  Apoaequorin (PREVAGEN PO), Take by mouth., Disp: , Rfl:  .  Ascorbic Acid (VITAMIN C) 100 MG tablet, Take 100 mg by mouth daily., Disp: , Rfl:  .  Cholecalciferol (D3-1000 PO), Take by mouth., Disp: , Rfl:   .  loratadine (CLARITIN) 10 MG tablet, Take 10 mg by mouth daily., Disp: , Rfl:  .  sertraline (ZOLOFT) 100 MG tablet, TAKE 1 TABLET BY MOUTH EVERY DAY, Disp: 30 tablet, Rfl: 0 .  traZODone (DESYREL) 50 MG tablet, TAKE 1 TABLET BY MOUTH AS NEEDED, Disp: 90 tablet, Rfl: 1  Review of Systems:  Constitutional: Denies fever, chills, diaphoresis, appetite change and fatigue.  HEENT: Denies photophobia, eye pain, redness, hearing loss, ear pain, congestion, sore throat, rhinorrhea, sneezing, mouth sores, trouble swallowing, neck pain, neck stiffness and tinnitus.   Respiratory: Denies SOB, DOE, cough, chest tightness,  and wheezing.   Cardiovascular: Denies chest pain, palpitations and leg swelling.  Gastrointestinal: Denies nausea, vomiting, abdominal pain, diarrhea, constipation, blood in stool and abdominal distention.  Genitourinary: Denies dysuria, urgency, frequency, hematuria, flank pain and difficulty urinating.  Endocrine: Denies: hot or cold intolerance, sweats, changes in hair or nails, polyuria, polydipsia. Musculoskeletal: Denies myalgias, back pain, joint swelling, arthralgias and gait problem.  Skin: Denies pallor, rash and wound.  Neurological: Denies dizziness, seizures, syncope, weakness, light-headedness, numbness and headaches.  Hematological: Denies adenopathy. Easy bruising, personal or family bleeding history  Psychiatric/Behavioral: Denies suicidal ideation, mood changes, confusion, nervousness, sleep disturbance and agitation    Physical Exam: Vitals:   03/08/21 1011  BP: 110/70  Pulse: 67  Temp: 98.7 F (37.1 C)  TempSrc: Oral  SpO2: 97%  Weight: 168 lb 3.2 oz (76.3 kg)  Height: _0  (1.575 m)    Body mass index is 30.76 kg/m.   Constitutional: NAD, calm, comfortable Eyes: PERRL, lids and conjunctivae normal ENMT: Mucous membranes are moist. Posterior pharynx clear of any exudate or lesions. Normal dentition. Tympanic membrane is pearly white, no erythema  or bulging on the right, left is obstructed by impacted cerumen. Neck: normal, supple, no masses, no thyromegaly Respiratory: clear to auscultation bilaterally, no wheezing, no crackles. Normal respiratory effort. No accessory muscle use.  Cardiovascular: Regular rate and rhythm, no murmurs / rubs / gallops. No extremity edema. 2+ pedal pulses.  Abdomen: no tenderness, no masses palpated. No hepatosplenomegaly. Bowel sounds positive.  Musculoskeletal: no clubbing / cyanosis. No joint deformity upper and lower extremities. Good ROM, no contractures. Normal muscle tone.  Skin: no rashes, lesions, ulcers. No induration Neurologic: CN 2-12 grossly intact. Sensation intact, DTR normal. Strength 5/5 in all 4.  Psychiatric: Normal judgment and insight. Alert and oriented x 3. Normal mood.    Subsequent Medicare wellness visit   1. Risk factors, based on past  M,S,F -cardiovascular disease risk factors include age only   2.  Physical activities: She exercises on a daily basis with walking and passive stretching   3.  Depression/mood:  Stable, not depressed   4.  Hearing:  Decreased hearing out of her left ear which she is recent   5.  ADL's: Independent in all ADLs   6.  Fall risk:  Low fall risk   7.  Home safety: No problems identified   8.  Height weight, and visual acuity: height and weight as above, vision:   Visual Acuity Screening   Right eye Left eye Both eyes  Without correction: _1  With correction:        9.  Counseling:  Advised to update her vaccination status and dental care needs.   10. Lab orders based on risk factors: Laboratory update will be reviewed   11. Referral :  None today   12. Care plan:  Follow-up with me in 6 months   13. Cognitive assessment:  No cognitive impairment identified   14. Screening: Patient provided with a written and personalized 5-10 year screening schedule in the AVS.   yes   15. Provider List Update:   PCP only  16.  Advance Directives: Full code   17. Opioids: Patient is not on any opioid prescriptions and has no risk factors for a substance use disorder.   [place] Office Visit from 05/29/2019 in [facility]  PHQ-9 Total Score 0      Fall Risk  03/08/2021 05/29/2019  Falls in the past year? 0 0  Number falls in past yr: 0 0  Injury with Fall? 0 0     Impression and Plan:  Encounter for preventive health examination -She has routine eye care, have advised routine dental care. -She will get updated pneumonia vaccinations today with PCV 20, she will get her Tdap and shingles vaccines as well as her Covid booster at the pharmacy. -Screening labs today. -Healthy lifestyle discussed in detail. -DEXA scan for osteoporosis screening today. -She had a mammogram in February of this year. -She had a colonoscopy in 2020, that was supposed to be her last one. -She no longer does Pap smears due to age.  Encounter for osteoporosis screening in asymptomatic postmenopausal patient  - Plan: DG Bone Density  Anxiety -Well-controlled, continue Zoloft.  Hearing loss of left ear due to cerumen impaction -Audiology referral. -Cerumen Desimpaction  After patient consent was obtained, warm water was applied and gentle ear lavage performed on left ear. There were no complications and following the desimpaction the tympanic membranes were visible. Tympanic membranes are intact following the procedure. Auditory canals are normal. The patient reported relief of symptoms after removal of cerumen.   Need for vaccination against Streptococcus pneumoniae -PCV 20 administered today   Patient Instructions   -Nice seeing you today!!  -Lab work today; will notify you once results are available.  -pneumonia vaccine today.  -Remember your tetanus and COVID booster at the pharmacy.  -Schedule follow up in 6 months.   Preventive Care 7 Years and Older, Female Preventive care refers  to lifestyle choices and visits with your health care provider that can promote health and wellness. This includes:  A yearly physical exam. This is also called an annual wellness visit.  Regular dental and eye exams.  Immunizations.  Screening for certain conditions.  Healthy lifestyle choices, such as: ? Eating a healthy diet. ? Getting regular exercise. ? Not using drugs or products that contain nicotine and tobacco. ? Limiting alcohol use. What can I expect for my preventive care visit? Physical exam Your health care provider will check your:  Height and weight. These may be used to calculate your BMI (body mass index). BMI is a measurement that tells if you are at a healthy weight.  Heart rate and blood pressure.  Body temperature.  Skin for abnormal spots. Counseling Your health care provider may ask you questions about your:  Past medical problems.  Family's medical history.  Alcohol, tobacco, and drug use.  Emotional well-being.  Home life and relationship well-being.  Sexual activity.  Diet, exercise, and sleep habits.  History of falls.  Memory and ability to understand (cognition).  Work and work Statistician.  Pregnancy and menstrual history.  Access to firearms. What immunizations do I need? Vaccines are usually given at various ages, according to a schedule. Your health care provider will recommend vaccines for you based on your age, medical history, and lifestyle or other factors, such as travel or where you work.   What tests do I need? Blood tests  Lipid and cholesterol levels. These may be checked every 5 years, or more often depending on your overall health.  Hepatitis C test.  Hepatitis B test. Screening  Lung cancer screening. You may have this screening every year starting at age 75 if you have a 30-pack-year history of smoking and currently smoke or have quit within the past 15 years.  Colorectal cancer screening. ? All adults  should have this screening starting at age 1 and continuing until age 35. ? Your health care provider may recommend screening at age 2 if you are at increased risk. ? You will have tests every 1-10 years, depending on your results and the type of screening test.  Diabetes screening. ? This is done by checking your blood sugar (glucose) after you have not eaten for a while (fasting). ? You may have this done every 1-3 years.  Mammogram. ? This may be done every 1-2 years. ? Talk with your health care provider about how often you should have regular mammograms.  Abdominal aortic aneurysm (AAA) screening. You may need this if you are a current or former smoker.  BRCA-related cancer screening. This may be done if you have a family history of breast,  ovarian, tubal, or peritoneal cancers. Other tests  STD (sexually transmitted disease) testing, if you are at risk.  Bone density scan. This is done to screen for osteoporosis. You may have this done starting at age 2. Talk with your health care provider about your test results, treatment options, and if necessary, the need for more tests. Follow these instructions at home: Eating and drinking  Eat a diet that includes fresh fruits and vegetables, whole grains, lean protein, and low-fat dairy products. Limit your intake of foods with high amounts of sugar, saturated fats, and salt.  Take vitamin and mineral supplements as recommended by your health care provider.  Do not drink alcohol if your health care provider tells you not to drink.  If you drink alcohol: ? Limit how much you have to 0-1 drink a day. ? Be aware of how much alcohol is in your drink. In the [place] one drink equals one 12 oz bottle of beer (355 mL), one 5 oz glass of wine (148 mL), or one 1 oz glass of hard liquor (44 mL).   Lifestyle  Take daily care of your teeth and gums. Brush your teeth every morning and night with fluoride toothpaste. Floss one time each  day.  Stay active. Exercise for at least 30 minutes 5 or more days each week.  Do not use any products that contain nicotine or tobacco, such as cigarettes, e-cigarettes, and chewing tobacco. If you need help quitting, ask your health care provider.  Do not use drugs.  If you are sexually active, practice safe sex. Use a condom or other form of protection in order to prevent STIs (sexually transmitted infections).  Talk with your health care provider about taking a low-dose aspirin or statin.  Find healthy ways to cope with stress, such as: ? Meditation, yoga, or listening to music. ? Journaling. ? Talking to a trusted person. ? Spending time with friends and family. Safety  Always wear your seat belt while driving or riding in a vehicle.  Do not drive: ? If you have been drinking alcohol. Do not ride with someone who has been drinking. ? When you are tired or distracted. ? While texting.  Wear a helmet and other protective equipment during sports activities.  If you have firearms in your house, make sure you follow all gun safety procedures. What's next?  Visit your health care provider once a year for an annual wellness visit.  Ask your health care provider how often you should have your eyes and teeth checked.  Stay up to date on all vaccines. This information is not intended to replace advice given to you by your health care provider. Make sure you discuss any questions you have with your health care provider. Document Revised: 11/16/2020 Document Reviewed: 11/20/2018 Elsevier Patient Education  2021 [place], [place] [facility]

## 2021-03-08 NOTE — Addendum Note (Signed)
Addended by: Westley Hummer B on: 03/08/2021 11:59 AM   Modules accepted: Orders

## 2021-03-08 NOTE — Patient Instructions (Signed)
-Nice seeing you today!!  -Lab work today; will notify you once results are available.  -pneumonia vaccine today.  -Remember your tetanus and COVID booster at the pharmacy.  -Schedule follow up in 6 months.   Preventive Care 80 Years and Older, Female Preventive care refers to lifestyle choices and visits with your health care provider that can promote health and wellness. This includes:  A yearly physical exam. This is also called an annual wellness visit.  Regular dental and eye exams.  Immunizations.  Screening for certain conditions.  Healthy lifestyle choices, such as: ? Eating a healthy diet. ? Getting regular exercise. ? Not using drugs or products that contain nicotine and tobacco. ? Limiting alcohol use. What can I expect for my preventive care visit? Physical exam Your health care provider will check your:  Height and weight. These may be used to calculate your BMI (body mass index). BMI is a measurement that tells if you are at a healthy weight.  Heart rate and blood pressure.  Body temperature.  Skin for abnormal spots. Counseling Your health care provider may ask you questions about your:  Past medical problems.  Family's medical history.  Alcohol, tobacco, and drug use.  Emotional well-being.  Home life and relationship well-being.  Sexual activity.  Diet, exercise, and sleep habits.  History of falls.  Memory and ability to understand (cognition).  Work and work Statistician.  Pregnancy and menstrual history.  Access to firearms. What immunizations do I need? Vaccines are usually given at various ages, according to a schedule. Your health care provider will recommend vaccines for you based on your age, medical history, and lifestyle or other factors, such as travel or where you work.   What tests do I need? Blood tests  Lipid and cholesterol levels. These may be checked every 5 years, or more often depending on your overall  health.  Hepatitis C test.  Hepatitis B test. Screening  Lung cancer screening. You may have this screening every year starting at age 80 if you have a 30-pack-year history of smoking and currently smoke or have quit within the past 15 years.  Colorectal cancer screening. ? All adults should have this screening starting at age 80 and continuing until age 37. ? Your health care provider may recommend screening at age 80 if you are at increased risk. ? You will have tests every 1-10 years, depending on your results and the type of screening test.  Diabetes screening. ? This is done by checking your blood sugar (glucose) after you have not eaten for a while (fasting). ? You may have this done every 1-3 years.  Mammogram. ? This may be done every 1-2 years. ? Talk with your health care provider about how often you should have regular mammograms.  Abdominal aortic aneurysm (AAA) screening. You may need this if you are a current or former smoker.  BRCA-related cancer screening. This may be done if you have a family history of breast, ovarian, tubal, or peritoneal cancers. Other tests  STD (sexually transmitted disease) testing, if you are at risk.  Bone density scan. This is done to screen for osteoporosis. You may have this done starting at age 80. Talk with your health care provider about your test results, treatment options, and if necessary, the need for more tests. Follow these instructions at home: Eating and drinking  Eat a diet that includes fresh fruits and vegetables, whole grains, lean protein, and low-fat dairy products. Limit your intake of foods  with high amounts of sugar, saturated fats, and salt.  Take vitamin and mineral supplements as recommended by your health care provider.  Do not drink alcohol if your health care provider tells you not to drink.  If you drink alcohol: ? Limit how much you have to 0-1 drink a day. ? Be aware of how much alcohol is in your  drink. In the [place] one drink equals one 12 oz bottle of beer (355 mL), one 5 oz glass of wine (148 mL), or one 1 oz glass of hard liquor (44 mL).   Lifestyle  Take daily care of your teeth and gums. Brush your teeth every morning and night with fluoride toothpaste. Floss one time each day.  Stay active. Exercise for at least 30 minutes 5 or more days each week.  Do not use any products that contain nicotine or tobacco, such as cigarettes, e-cigarettes, and chewing tobacco. If you need help quitting, ask your health care provider.  Do not use drugs.  If you are sexually active, practice safe sex. Use a condom or other form of protection in order to prevent STIs (sexually transmitted infections).  Talk with your health care provider about taking a low-dose aspirin or statin.  Find healthy ways to cope with stress, such as: ? Meditation, yoga, or listening to music. ? Journaling. ? Talking to a trusted person. ? Spending time with friends and family. Safety  Always wear your seat belt while driving or riding in a vehicle.  Do not drive: ? If you have been drinking alcohol. Do not ride with someone who has been drinking. ? When you are tired or distracted. ? While texting.  Wear a helmet and other protective equipment during sports activities.  If you have firearms in your house, make sure you follow all gun safety procedures. What's next?  Visit your health care provider once a year for an annual wellness visit.  Ask your health care provider how often you should have your eyes and teeth checked.  Stay up to date on all vaccines. This information is not intended to replace advice given to you by your health care provider. Make sure you discuss any questions you have with your health care provider. Document Revised: 11/16/2020 Document Reviewed: 11/20/2018 Elsevier Patient Education  2021 [employer].

## 2021-03-14 ENCOUNTER — Ambulatory Visit
Admission: RE | Admit: 2021-03-14 | Discharge: 2021-03-14 | Disposition: A | Discharge status: Home / Self Care | Payer: Medicare Other | Source: Ambulatory Visit | Attending: Internal Medicine | Admitting: Internal Medicine

## 2021-03-14 ENCOUNTER — Other Ambulatory Visit: Payer: Self-pay

## 2021-03-14 DIAGNOSIS — R928 Other abnormal and inconclusive findings on diagnostic imaging of breast: Secondary | ICD-10-CM | POA: Diagnosis not present

## 2021-03-14 DIAGNOSIS — R922 Inconclusive mammogram: Secondary | ICD-10-CM | POA: Diagnosis not present

## 2021-03-14 DIAGNOSIS — N6320 Unspecified lump in the left breast, unspecified quadrant: Secondary | ICD-10-CM | POA: Diagnosis not present

## 2021-04-04 ENCOUNTER — Other Ambulatory Visit: Payer: Self-pay | Admitting: Internal Medicine

## 2021-04-04 DIAGNOSIS — F419 Anxiety disorder, unspecified: Secondary | ICD-10-CM

## 2021-11-27 ENCOUNTER — Other Ambulatory Visit: Payer: Self-pay | Admitting: Internal Medicine

## 2021-11-27 DIAGNOSIS — F419 Anxiety disorder, unspecified: Secondary | ICD-10-CM

## 2021-12-28 ENCOUNTER — Encounter: Payer: Self-pay | Admitting: Adult Health

## 2021-12-28 ENCOUNTER — Ambulatory Visit (INDEPENDENT_AMBULATORY_CARE_PROVIDER_SITE_OTHER): Payer: Medicare Other | Admitting: Adult Health

## 2021-12-28 ENCOUNTER — Other Ambulatory Visit: Payer: Self-pay

## 2021-12-28 DIAGNOSIS — Z91199 Patient's noncompliance with other medical treatment and regimen due to unspecified reason: Secondary | ICD-10-CM

## 2021-12-28 NOTE — Progress Notes (Signed)
No show for transfer of care appointment.

## 2022-02-19 ENCOUNTER — Other Ambulatory Visit: Payer: Self-pay

## 2022-02-19 ENCOUNTER — Ambulatory Visit (INDEPENDENT_AMBULATORY_CARE_PROVIDER_SITE_OTHER)
Admission: RE | Admit: 2022-02-19 | Discharge: 2022-02-19 | Disposition: A | Discharge status: Home / Self Care | Payer: Medicare (Managed Care) | Source: Ambulatory Visit | Attending: Nurse Practitioner | Admitting: Nurse Practitioner

## 2022-02-19 ENCOUNTER — Ambulatory Visit: Payer: Medicare (Managed Care) | Admitting: Nurse Practitioner

## 2022-02-19 ENCOUNTER — Encounter: Payer: Self-pay | Admitting: Nurse Practitioner

## 2022-02-19 VITALS — BP 128/86 | HR 76 | Temp 97.0°F | Resp 12 | Ht 61.5 in | Wt 170.5 lb

## 2022-02-19 DIAGNOSIS — R5383 Other fatigue: Secondary | ICD-10-CM | POA: Diagnosis not present

## 2022-02-19 DIAGNOSIS — I878 Other specified disorders of veins: Secondary | ICD-10-CM | POA: Diagnosis not present

## 2022-02-19 DIAGNOSIS — D72819 Decreased white blood cell count, unspecified: Secondary | ICD-10-CM | POA: Diagnosis not present

## 2022-02-19 DIAGNOSIS — F419 Anxiety disorder, unspecified: Secondary | ICD-10-CM

## 2022-02-19 DIAGNOSIS — Z0000 Encounter for general adult medical examination without abnormal findings: Secondary | ICD-10-CM | POA: Insufficient documentation

## 2022-02-19 DIAGNOSIS — E7800 Pure hypercholesterolemia, unspecified: Secondary | ICD-10-CM | POA: Insufficient documentation

## 2022-02-19 DIAGNOSIS — Z833 Family history of diabetes mellitus: Secondary | ICD-10-CM | POA: Insufficient documentation

## 2022-02-19 DIAGNOSIS — R109 Unspecified abdominal pain: Secondary | ICD-10-CM | POA: Diagnosis not present

## 2022-02-19 DIAGNOSIS — Z7689 Persons encountering health services in other specified circumstances: Secondary | ICD-10-CM | POA: Insufficient documentation

## 2022-02-19 LAB — CBC
HCT: 40.8 % (ref 36.0–46.0)
Hemoglobin: 13.6 g/dL (ref 12.0–15.0)
MCHC: 33.5 g/dL (ref 30.0–36.0)
MCV: 89.1 fl (ref 78.0–100.0)
Platelets: 215 10*3/uL (ref 150.0–400.0)
RBC: 4.57 Mil/uL (ref 3.87–5.11)
RDW: 13 % (ref 11.5–15.5)
WBC: 4.2 10*3/uL (ref 4.0–10.5)

## 2022-02-19 LAB — LIPID PANEL
Cholesterol: 244 mg/dL — ABNORMAL HIGH (ref 0–200)
HDL: 86.5 mg/dL (ref >39.00)
LDL Cholesterol: 142 mg/dL — ABNORMAL HIGH (ref 0–99)
NonHDL: 157.84
Total CHOL/HDL Ratio: 3
Triglycerides: 77 mg/dL (ref 0.0–149.0)
VLDL: 15.4 mg/dL (ref 0.0–40.0)

## 2022-02-19 LAB — COMPREHENSIVE METABOLIC PANEL
ALT: 9 U/L (ref 0–35)
AST: 18 U/L (ref 0–37)
Albumin: 4.5 g/dL (ref 3.5–5.2)
Alkaline Phosphatase: 69 U/L (ref 39–117)
BUN: 7 mg/dL (ref 6–23)
CO2: 33 mEq/L — ABNORMAL HIGH (ref 19–32)
Calcium: 9.5 mg/dL (ref 8.4–10.5)
Chloride: 102 mEq/L (ref 96–112)
Creatinine, Ser: 0.77 mg/dL (ref 0.40–1.20)
GFR: 72.82 mL/min (ref >60.00)
Glucose, Bld: 92 mg/dL (ref 70–99)
Potassium: 3.5 mEq/L (ref 3.5–5.1)
Sodium: 144 mEq/L (ref 135–145)
Total Bilirubin: 0.4 mg/dL (ref 0.2–1.2)
Total Protein: 7 g/dL (ref 6.0–8.3)

## 2022-02-19 LAB — TSH: TSH: 0.78 u[IU]/mL (ref 0.35–5.50)

## 2022-02-19 LAB — LIPASE: Lipase: 7 U/L — ABNORMAL LOW (ref 11.0–59.0)

## 2022-02-19 LAB — HEMOGLOBIN A1C: Hgb A1c MFr Bld: 5.6 % (ref 4.6–6.5)

## 2022-02-19 NOTE — Assessment & Plan Note (Signed)
Patient has family history of diabetes.  Pending A1c ?

## 2022-02-19 NOTE — Patient Instructions (Signed)
Nice to see you today ?I will be in touch with lab results once I have them ?Follow up with me in 6 months, sooner if needed ? ?Call [facility] to get the bone scan and mammogram ?

## 2022-02-19 NOTE — Progress Notes (Signed)
? ?New Patient Office Visit ? ?Subjective:  ?Patient ID: Susan Goodman, female    DOB: 1941/06/10  Age: 81 y.o. MRN: [id number] ? ?CC:  ?Chief Complaint  ?Patient presents with  ? Establish Care  ?  Previous PCP Dr Jerilee Hoh  ? Abdominal issue  ?  Sx present for about 2 months. Has been feeling nagging sensation in her stomach, has had a lot of gas and some constipation. No nausea or vomiting.  ? ? ?HPI ?Susan Goodman presents for Establish care with a new provider  ? ?Anxiety: States that she feels like she is doing ok. Waxes and wan. Has tried to take her self off the Zoloft before without weaning and describes getting a headache ? ?Abdominal issue: Started approx 2 months ago. States that she has a nagging feeling.States that it is intermittent. BM last night. States that she has had trouble with bowels, recently. Does describe some pellet like stools. Not sure if it is realted to her eating or not. She does eat prunes to keep her bowels regular ? ? ?Immunizations: ?-Tetanus: needs updating, Discussed getting at pharmacy ?-Influenza: refused ?-Covid-19: pfizer x2 ?-Shingles: discussed getting at pharmacy ?-Pneumonia: PCV 20 ? ?-HPV: aged out ? ?Diet: Fair diet. Good breakfast, lunch, some snacks.Grape juice, Ginger ale soda, and water ?Exercise: No regular exercise. Was walking on a regular but the pollen keeps her from going out and walking ? ?Eye exam: Completes annually. Reading  glasses once a year with the eye professional ?Dental exam: Completes semi-annually. Needs updating.   ? ?Pap Smear: Completed in Aged out ?Mammogram: Completed in 2022 ?Colonoscopy: Completed in 2020 ?Dexa: Completed in ? Order in to have done ?PSA: NA ? ?Lung Cancer Screening: NA  ? ?Sleep: 12a and get up around around 6 am. Nocutira x 1 ? ?PHQ9 SCORE ONLY 02/19/2022 03/08/2021 05/29/2019  ?PHQ-9 Total Score 5 5 0  ?  ?GAD 7 : Generalized Anxiety Score 02/19/2022  ?Nervous, Anxious, on Edge 0  ?Control/stop worrying 2   ?Worry too much - different things 2  ?Trouble relaxing 1  ?Restless 1  ?Easily annoyed or irritable 1  ?Afraid - awful might happen 2  ?Total GAD 7 Score 9  ?Anxiety Difficulty Somewhat difficult  ? ?  ?Past Medical History:  ?Diagnosis Date  ? Anxiety   ? Arthritis   ? Cataract   ? GERD (gastroesophageal reflux disease)   ? History of colon polyps   ? ? ?Past Surgical History:  ?Procedure Laterality Date  ? CATARACT EXTRACTION Bilateral   ? COLONOSCOPY  2010  ? In [place]   ? OVARIAN CYST REMOVAL    ? 1980's   ? ? ?Family History  ?Problem Relation Age of Onset  ? Arthritis Mother   ? Diabetes Mother   ? Alzheimer's disease Mother   ? Hypertension Father   ? Diabetes Father   ? Heart disease Father   ?     triple bypass  ? Colon cancer Father   ? Kidney Stones Father   ? Lung cancer Sister   ? Diabetes Sister   ? Diabetes Sister   ? Diabetes Sister   ? Heart attack Brother   ? Lung cancer Brother   ? Breast cancer Maternal Grandmother   ? Esophageal cancer Neg Hx   ? Rectal cancer Neg Hx   ? Stomach cancer Neg Hx   ? ? ?Social History  ? ?Socioeconomic History  ? Marital status: Divorced  ?  Spouse name: Not on file  ? Number of children: Not on file  ? Years of education: Not on file  ? Highest education level: Not on file  ?Occupational History  ? Occupation: Retired  ?Tobacco Use  ? Smoking status: Never  ? Smokeless tobacco: Never  ?Vaping Use  ? Vaping Use: Never used  ?Substance and Sexual Activity  ? Alcohol use: Never  ? Drug use: Never  ? Sexual activity: Not on file  ?Other Topics Concern  ? Not on file  ?Social History Narrative  ? Retired.  ?   ? Hobbies": projects with church :  ? ?Social Determinants of Health  ? ?Financial Resource Strain: Not on file  ?Food Insecurity: Not on file  ?Transportation Needs: Not on file  ?Physical Activity: Not on file  ?Stress: Not on file  ?Social Connections: Not on file  ?Intimate Partner Violence: Not on file  ? ? ?ROS ?Review of Systems  ?Constitutional:   Positive for fatigue. Negative for chills and fever.  ?Respiratory:  Negative for cough and shortness of breath.   ?Cardiovascular:  Negative for chest pain.  ?Gastrointestinal:  Positive for abdominal pain and constipation. Negative for diarrhea, nausea and vomiting.  ?Genitourinary:  Negative for difficulty urinating, dysuria and hematuria.  ?Neurological:  Negative for headaches.  ? ?Objective:  ? ?Today's Vitals: BP 128/86   Pulse 76   Temp (!) 97 ?F (36.1 ?C)   Resp 12   Ht 5' 1.5" (1.562 m)   Wt 170 lb 8 oz (77.3 kg)   SpO2 96%   BMI 31.69 kg/m?  ? ?Physical Exam ?Vitals and nursing note reviewed.  ?Constitutional:   ?   Appearance: Normal appearance.  ?HENT:  ?   Right Ear: Tympanic membrane, ear canal and external ear normal.  ?   Left Ear: Tympanic membrane, ear canal and external ear normal.  ?   Mouth/Throat:  ?   Mouth: Mucous membranes are moist.  ?   Pharynx: Oropharynx is clear.  ?Eyes:  ?   Extraocular Movements: Extraocular movements intact.  ?   Pupils: Pupils are equal, round, and reactive to light.  ?Cardiovascular:  ?   Rate and Rhythm: Normal rate and regular rhythm.  ?   Heart sounds: Normal heart sounds.  ?Pulmonary:  ?   Breath sounds: Normal breath sounds.  ?Abdominal:  ?   General: Bowel sounds are normal. There is no distension.  ?   Palpations: There is no mass.  ?   Tenderness: There is no abdominal tenderness.  ?   Hernia: No hernia is present.  ?Lymphadenopathy:  ?   Cervical: No cervical adenopathy.  ?Neurological:  ?   Mental Status: She is alert.  ? ? ?Assessment & Plan:  ? ?Problem List Items Addressed This Visit   ? ?  ? Other  ? Anxiety  ?  Patient has diagnosis of anxiety.  PHQ-9 and GAD-7 both reassuring in office today.  GAD-7 score 9, no previous was on file.  Taking Zoloft 100 mg daily.  She did try to wean herself off of the medication at some point and had withdrawal symptoms.  Did have discussion with her and her niece who are at bedside about wanting to wean off  medication to consult with [place] and we can help her wean off to avoid unwanted withdrawal symptoms and side effects.  Both acknowledged ?  ?  ? Encounter to establish care with new doctor - Primary  ?  Patient switching primary care providers due to location of clinics.  Did review last annual visit note and imaging in chart. ?  ?  ? Family history of diabetes mellitus  ?  Patient has family history of diabetes.  Pending A1c ?  ?  ? Relevant Orders  ? Hemoglobin A1c  ? Abdominal discomfort  ?  Very ambiguous in nature.  Patient having hard time to qualify discomfort feeling.  Does seem like she may have some constipation going on.  She does use prunes to help keep her bowels regular.  We will obtain basic lab work and abdominal x-ray, pending results. ?  ?  ? Relevant Orders  ? CBC  ? Comprehensive metabolic panel  ? Lipase  ? DG Abd 2 Views  ? Pure hypercholesterolemia  ? Relevant Orders  ? Lipid panel  ? Leukopenia  ?  Last CBC blood count was 3.9, mildly leukopenic.  Pending labs today ?  ?  ? Relevant Orders  ? CBC  ? ?Other Visit Diagnoses   ? ? Other fatigue      ? Relevant Orders  ? TSH  ? ?  ? ? ?Outpatient Encounter Medications as of 02/19/2022  ?Medication Sig  ? Apoaequorin (PREVAGEN PO) Take by mouth.  ? Ascorbic Acid (VITAMIN C) 100 MG tablet Take 100 mg by mouth daily.  ? Cholecalciferol (D3-1000 PO) Take by mouth.  ? loratadine (CLARITIN) 10 MG tablet Take 10 mg by mouth daily.  ? sertraline (ZOLOFT) 100 MG tablet TAKE 1 TABLET BY MOUTH EVERY DAY  ? [DISCONTINUED] traZODone (DESYREL) 50 MG tablet TAKE 1 TABLET BY MOUTH AS NEEDED  ? ?No facility-administered encounter medications on file as of 02/19/2022.  ? ? ?Follow-up: Return in about 6 months (around 08/22/2022) for CPE. ? ?This visit occurred during the SARS-CoV-2 public health emergency.  Safety protocols were in place, including screening questions prior to the visit, additional usage of staff PPE, and extensive cleaning of exam room while observing  appropriate contact time as indicated for disinfecting solutions.  ?   ? ?Romilda Garret, NP ? ?

## 2022-02-19 NOTE — Assessment & Plan Note (Signed)
Patient has diagnosis of anxiety.  PHQ-9 and GAD-7 both reassuring in office today.  GAD-7 score 9, no previous was on file.  Taking Zoloft 100 mg daily.  She did try to wean herself off of the medication at some point and had withdrawal symptoms.  Did have discussion with her and her niece who are at bedside about wanting to wean off medication to consult with [place] and we can help her wean off to avoid unwanted withdrawal symptoms and side effects.  Both acknowledged ?

## 2022-02-19 NOTE — Assessment & Plan Note (Signed)
Patient switching primary care providers due to location of clinics.  Did review last annual visit note and imaging in chart. ?

## 2022-02-19 NOTE — Assessment & Plan Note (Signed)
Very ambiguous in nature.  Patient having hard time to qualify discomfort feeling.  Does seem like she may have some constipation going on.  She does use prunes to help keep her bowels regular.  We will obtain basic lab work and abdominal x-ray, pending results. ?

## 2022-02-19 NOTE — Assessment & Plan Note (Signed)
Last CBC blood count was 3.9, mildly leukopenic.  Pending labs today ?

## 2022-02-21 ENCOUNTER — Encounter: Payer: Self-pay | Admitting: Adult Health

## 2022-04-16 ENCOUNTER — Telehealth: Payer: Self-pay | Admitting: Nurse Practitioner

## 2022-04-16 NOTE — Telephone Encounter (Signed)
Tried calling patient to schedule Medicare Annual Wellness Visit (AWV) either virtually or phone. ? ?No answer ? ?Last AWV 03/08/21 ?; please schedule at anytime with health coach ? ?My direct # [phone] ?

## 2022-08-06 ENCOUNTER — Other Ambulatory Visit: Payer: Self-pay | Admitting: Internal Medicine

## 2022-08-06 DIAGNOSIS — F419 Anxiety disorder, unspecified: Secondary | ICD-10-CM

## 2022-10-08 ENCOUNTER — Telehealth: Payer: Self-pay | Admitting: Nurse Practitioner

## 2022-10-08 NOTE — Telephone Encounter (Signed)
Left message for patient to call back and schedule Medicare Annual Wellness Visit (AWV) either virtually or phone . Left  my Herbie Drape number [phone]   Last AWV 03/08/21    45 min for awv-i and in office appointments 30 min for awv-s  phone/virtual appointments

## 2022-10-11 ENCOUNTER — Ambulatory Visit: Payer: Medicare (Managed Care) | Admitting: Family Medicine

## 2022-10-11 ENCOUNTER — Telehealth: Payer: Self-pay

## 2022-10-11 NOTE — Telephone Encounter (Signed)
Noted will evaluate in office 

## 2022-10-11 NOTE — Telephone Encounter (Signed)
Contractor (DPR signed) calling and she added pt to conversation; pt having problem clearing her throat a lot with dry cough for approx 6 months; no S/T or SOB. For 1 - 2 months pt has had wooziness or lightheadedness when first wakes up that last on and off for couple of hours and then goes away. Pt said happens almost every morning. Pt still having upper mid dull abd pain on and off for 5 or more months. Pt has had this since established care with Romilda Garret FNP on 02/19/22. See 02/20/22 lab results note; pt has been drinking more water and prune juice but has not gotten senna or miralax.pt said had last BM that was somewhat loose 3 days ago. No N&V, No diarrhea and no fever.  Pt said that spicy makes abd pain worse but pt is not eating spicy foods.pt cannot schedule appt today due to transportation. Pt scheduled appt on 10/12/22 at 4 PM (cannot come earlier) with UC & ED precautions that pt and Gwenette Greet voiced understanding. Sending note to Romilda Garret NP and Anastasiya CMA.

## 2022-10-12 ENCOUNTER — Ambulatory Visit: Payer: Medicare (Managed Care) | Admitting: Nurse Practitioner

## 2022-10-15 ENCOUNTER — Ambulatory Visit: Payer: Medicare (Managed Care) | Admitting: Nurse Practitioner

## 2022-10-15 VITALS — BP 106/62 | HR 69 | Temp 98.7°F | Resp 12 | Ht 61.5 in | Wt 164.5 lb

## 2022-10-15 DIAGNOSIS — F419 Anxiety disorder, unspecified: Secondary | ICD-10-CM

## 2022-10-15 DIAGNOSIS — N390 Urinary tract infection, site not specified: Secondary | ICD-10-CM | POA: Diagnosis not present

## 2022-10-15 DIAGNOSIS — R42 Dizziness and giddiness: Secondary | ICD-10-CM

## 2022-10-15 DIAGNOSIS — R300 Dysuria: Secondary | ICD-10-CM | POA: Diagnosis not present

## 2022-10-15 LAB — POC URINALSYSI DIPSTICK (AUTOMATED)
Bilirubin, UA: NEGATIVE
Glucose, UA: NEGATIVE
Ketones, UA: POSITIVE
Nitrite, UA: NEGATIVE
Protein, UA: POSITIVE — AB
Spec Grav, UA: >=1.03 — AB (ref 1.010–1.025)
Urobilinogen, UA: 0.2 E.U./dL
pH, UA: 5.5 (ref 5.0–8.0)

## 2022-10-15 LAB — BASIC METABOLIC PANEL
BUN: 6 mg/dL (ref 6–23)
CO2: 31 mEq/L (ref 19–32)
Calcium: 9.2 mg/dL (ref 8.4–10.5)
Chloride: 104 mEq/L (ref 96–112)
Creatinine, Ser: 0.78 mg/dL (ref 0.40–1.20)
GFR: 71.37 mL/min (ref >60.00)
Glucose, Bld: 80 mg/dL (ref 70–99)
Potassium: 4 mEq/L (ref 3.5–5.1)
Sodium: 143 mEq/L (ref 135–145)

## 2022-10-15 LAB — CBC
HCT: 38.2 % (ref 36.0–46.0)
Hemoglobin: 12.5 g/dL (ref 12.0–15.0)
MCHC: 32.8 g/dL (ref 30.0–36.0)
MCV: 90.6 fl (ref 78.0–100.0)
Platelets: 210 10*3/uL (ref 150.0–400.0)
RBC: 4.21 Mil/uL (ref 3.87–5.11)
RDW: 13.5 % (ref 11.5–15.5)
WBC: 5.8 10*3/uL (ref 4.0–10.5)

## 2022-10-15 MED ORDER — BUSPIRONE HCL 5 MG PO TABS: 5.0000 mg | ORAL_TABLET | Freq: Two times a day (BID) | ORAL | 0 refills | Status: DC

## 2022-10-15 MED ORDER — SULFAMETHOXAZOLE-TRIMETHOPRIM 800-160 MG PO TABS: 1.0000 | ORAL_TABLET | Freq: Two times a day (BID) | ORAL | 0 refills | Status: AC

## 2022-10-15 NOTE — Patient Instructions (Signed)
Nice to see you today I will be in touch with the labs once I have them It is ok to drink the prune juice daily. If you start having diarrhea you can cut back on it Increase your fluid intake. I want your urine a pale yellow color. Follow up with me in 1 month, sooner if you need me

## 2022-10-15 NOTE — Progress Notes (Signed)
Established Patient Office Visit  Subjective   Patient ID: Matty Deamer, female    DOB: 1941/01/19  Age: 81 y.o. MRN: [id number]  Chief Complaint  Patient presents with   Dizziness    X 2 to 3 months, when she wakes up in the morning and tries to get up. Some burning with urination noticed it about 2 weeks ago. Some rumbling in the stomach, nagging/dull discomfort present.      Dizziness: has been happening for the past 5 months per patient report.most times in the morning when she moves and it is a light headed sensations. States that it will go away. States that when she walks it will go away. States urine is an orange color. States that she stopped her sertraline approximately 2 months ago.  Did not wean off.  Patient states that she urinates approximately 3 times a day.  Dysuria: Started approx a few days ago. Not constant.  Patient denies any other symptoms inclusive of hematuria, back pain, nausea, vomiting.  Abdominal pain: patient was seen by me on 02/19/2022 with ambiguus abdominal discomfort. We did do basic blood work and an abdominal xray. Did show some moderate stool burden. Patient blood work was unremarkable. States that she will do prune juice some days to keep her bowels moving. States last BM was today and it was small by report.     Review of Systems  Constitutional:  Negative for chills.  Respiratory:  Negative for sputum production.   Cardiovascular:  Negative for chest pain.  Gastrointestinal:  Positive for constipation. Negative for abdominal pain, diarrhea, nausea and vomiting.  Neurological:  Positive for dizziness.      Objective:     BP 106/62   Pulse 69   Temp 98.7 F (37.1 C) (Temporal)   Resp 12   Ht 5' 1.5" (1.562 m)   Wt 164 lb 8 oz (74.6 kg)   SpO2 96%   BMI 30.58 kg/m     Physical Exam Vitals and nursing note reviewed.  Constitutional:      Appearance: Normal appearance.  HENT:     Right Ear: Tympanic membrane, ear  canal and external ear normal.     Left Ear: Tympanic membrane, ear canal and external ear normal.     Mouth/Throat:     Mouth: Mucous membranes are moist.     Pharynx: Oropharynx is clear.  Eyes:     Extraocular Movements: Extraocular movements intact.     Pupils: Pupils are equal, round, and reactive to light.  Cardiovascular:     Rate and Rhythm: Normal rate and regular rhythm.     Heart sounds: Normal heart sounds.  Pulmonary:     Effort: Pulmonary effort is normal.     Breath sounds: Normal breath sounds.  Abdominal:     General: Bowel sounds are normal. There is no distension.     Palpations: There is no mass.     Tenderness: There is no abdominal tenderness. There is no right CVA tenderness or left CVA tenderness.     Hernia: No hernia is present.  Lymphadenopathy:     Cervical: No cervical adenopathy.  Neurological:     General: No focal deficit present.     Mental Status: She is alert.      Results for orders placed or performed in visit on 10/15/22  POCT Urinalysis Dipstick (Automated)  Result Value Ref Range   Color, UA dark yellow    Clarity, UA cloudy  Glucose, UA Negative Negative   Bilirubin, UA negative    Ketones, UA positive    Spec Grav, UA >=1.030 (A) 1.010 - 1.025   Blood, UA trace    pH, UA 5.5 5.0 - 8.0   Protein, UA Positive (A) Negative   Urobilinogen, UA 0.2 0.2 or 1.0 E.U./dL   Nitrite, UA negative    Leukocytes, UA Large (3+) (A) Negative      The ASCVD Risk score (Arnett DK, et al., 2019) failed to calculate for the following reasons:   The 2019 ASCVD risk score is only valid for ages 71 to 35    Assessment & Plan:   Problem List Items Addressed This Visit       Genitourinary   Urinary tract infection without hematuria    UA indicative of infection.  We will treat with Bactrim DS 1 tab twice daily for 3 days.  Pending urinary culture encourage patient drink more water      Relevant Medications   sulfamethoxazole-trimethoprim  (BACTRIM DS) 800-160 MG tablet     Other   Anxiety    Patient has taken herself off of Zoloft.  Has been off medication for 2 months.  We will start her on BuSpar 5 mg twice daily follow-up 1 month.      Relevant Medications   busPIRone (BUSPAR) 5 MG tablet   Lightheaded    Orthostatics negative in office.  Do think this is secondary to dehydration.  Patient looks dehydrated per urine results today.  Urged patient to drink enough water to get her urine pale yellow color.      Relevant Orders   CBC   Basic metabolic panel   Burning with urination - Primary    UA in office.      Relevant Orders   POCT Urinalysis Dipstick (Automated) (Completed)   Urine Culture    Return in about 4 weeks (around 11/12/2022) for Medication recheck, buspar.    Romilda Garret, NP

## 2022-10-15 NOTE — Assessment & Plan Note (Signed)
UA in office. 

## 2022-10-15 NOTE — Assessment & Plan Note (Signed)
UA indicative of infection.  We will treat with Bactrim DS 1 tab twice daily for 3 days.  Pending urinary culture encourage patient drink more water

## 2022-10-15 NOTE — Assessment & Plan Note (Signed)
Patient has taken herself off of Zoloft.  Has been off medication for 2 months.  We will start her on BuSpar 5 mg twice daily follow-up 1 month.

## 2022-10-15 NOTE — Assessment & Plan Note (Signed)
Orthostatics negative in office.  Do think this is secondary to dehydration.  Patient looks dehydrated per urine results today.  Urged patient to drink enough water to get her urine pale yellow color.

## 2022-10-16 ENCOUNTER — Telehealth: Payer: Self-pay | Admitting: Nurse Practitioner

## 2022-10-16 LAB — URINE CULTURE
MICRO NUMBER:: 14148878
SPECIMEN QUALITY:: ADEQUATE

## 2022-10-16 NOTE — Telephone Encounter (Signed)
Patient niece called and was returning [place] phone call.

## 2022-10-17 NOTE — Telephone Encounter (Signed)
Pt called back stating she received a missed call Anastasiya. Gave pt lab results. Pt stated her symptoms are better since she's been taking the antibiotic. Call back # [phone]

## 2022-10-17 NOTE — Telephone Encounter (Signed)
See lab/urine results Left message for Ms Eulas Post to call back

## 2022-11-05 ENCOUNTER — Telehealth: Payer: Self-pay

## 2022-11-05 NOTE — Telephone Encounter (Signed)
Unsuccessful attempt to reach patient x3 on preferred number listed in notes for scheduled AWV. Left message on voicemail okay to reschedule. 

## 2022-11-13 ENCOUNTER — Other Ambulatory Visit: Payer: Self-pay | Admitting: Nurse Practitioner

## 2022-11-13 DIAGNOSIS — F419 Anxiety disorder, unspecified: Secondary | ICD-10-CM

## 2022-11-14 NOTE — Telephone Encounter (Signed)
LVM for patient to call back. ?

## 2022-11-16 NOTE — Telephone Encounter (Signed)
[email or web address]

## 2022-11-16 NOTE — Telephone Encounter (Signed)
Gwenette Greet (niece on [place] ) notified as instructed by telephone regarding appointment. Gwenette Greet stated that she will get with the patient and call back and schedule the follow-up appointment. Primus Bravo to let the front office know to send the appointment information to PCP so refill can be sent in.

## 2022-11-22 NOTE — Telephone Encounter (Signed)
Called patients niece and set up appointment, for 12/24/21. She states the patient has not been taking the medication due to it making her feel "woozy".  She states they will discuss a new plan when they come to office visit in January.

## 2022-12-24 ENCOUNTER — Ambulatory Visit: Payer: Medicare (Managed Care) | Admitting: Nurse Practitioner

## 2022-12-24 ENCOUNTER — Telehealth: Payer: Self-pay | Admitting: Nurse Practitioner

## 2022-12-24 NOTE — Telephone Encounter (Signed)
LVM for pt to rtn my call to schedule AWV with [facility] call back # [phone] 

## 2023-02-04 ENCOUNTER — Ambulatory Visit (INDEPENDENT_AMBULATORY_CARE_PROVIDER_SITE_OTHER): Payer: Medicare (Managed Care)

## 2023-02-04 VITALS — Ht 61.5 in | Wt 164.0 lb

## 2023-02-04 DIAGNOSIS — Z780 Asymptomatic menopausal state: Secondary | ICD-10-CM

## 2023-02-04 DIAGNOSIS — Z0000 Encounter for general adult medical examination without abnormal findings: Secondary | ICD-10-CM

## 2023-02-04 NOTE — Progress Notes (Signed)
I connected with  [name] and pt's niece Freeman Caldron on 02/04/23 by a audio enabled telemedicine application and verified that I am speaking with the correct person using two identifiers.  Patient Location: Home  Provider Location: Office/Clinic  I discussed the limitations of evaluation and management by telemedicine. The patient expressed understanding and agreed to proceed.  Subjective:   Susan Goodman is a 82 y.o. female who presents for Medicare Annual (Subsequent) preventive examination.  Review of Systems     Cardiac Risk Factors include: advanced age (>67mn, >>37women);sedentary lifestyle     Objective:    Today's Vitals   02/04/23 1027  Weight: 164 lb (74.4 kg)  Height: 5' 1.5" (1.562 m)   Body mass index is 30.49 kg/m.     02/04/2023   10:34 AM  Advanced Directives  Does Patient Have a Medical Advance Directive? Yes  Type of [place]  Copy of HEddystonein Chart? No - copy requested    Current Medications (verified) Outpatient Encounter Medications as of 02/04/2023  Medication Sig   Ascorbic Acid (VITAMIN C) 100 MG tablet Take 100 mg by mouth daily.   busPIRone (BUSPAR) 5 MG tablet Take 1 tablet (5 mg total) by mouth 2 (two) times daily. (Patient not taking: Reported on 02/04/2023)   Cholecalciferol (D3-1000 PO) Take by mouth. (Patient not taking: Reported on 10/15/2022)   loratadine (CLARITIN) 10 MG tablet Take 10 mg by mouth daily. (Patient not taking: Reported on 10/15/2022)   No facility-administered encounter medications on file as of 02/04/2023.    Allergies (verified) Patient has no known allergies.   History: Past Medical History:  Diagnosis Date   Anxiety    Arthritis    Cataract    GERD (gastroesophageal reflux disease)    History of colon polyps    Past Surgical History:  Procedure Laterality Date   CATARACT EXTRACTION Bilateral    [place]   OVARIAN CYST REMOVAL     1980's    Family History  Problem Relation Age of Onset   Arthritis Mother    Diabetes Mother    Alzheimer's disease Mother    Hypertension Father    Diabetes Father    Heart disease Father        triple bypass   Colon cancer Father    Kidney Stones Father    Lung cancer Sister    Diabetes Sister    Diabetes Sister    Diabetes Sister    Heart attack Brother    Lung cancer Brother    Breast cancer Maternal Grandmother    Esophageal cancer Neg Hx    Rectal cancer Neg Hx    Stomach cancer Neg Hx    Social History   Socioeconomic History   Marital status: Divorced    Spouse name: Not on file   Number of children: Not on file   Years of education: Not on file   Highest education level: Not on file  Occupational History   Occupation: Retired  Tobacco Use   Smoking status: Never   Smokeless tobacco: Never  Vaping Use   Vaping Use: Never used  Substance and Sexual Activity   Alcohol use: Never   Drug use: Never   Sexual activity: Not on file  Other Topics Concern   Not on file  Social History Narrative   Retired.      Hobbies": projects with  church :   Social Determinants of Health   Financial Resource Strain: Low Risk  (02/04/2023)   Overall Financial Resource Strain (CARDIA)    Difficulty of Paying Living Expenses: Not hard at all  Food Insecurity: No Food Insecurity (02/04/2023)   Hunger Vital Sign    Worried About Running Out of Food in the Last Year: Never true    Ran Out of Food in the Last Year: Never true  Transportation Needs: No Transportation Needs (02/04/2023)   PRAPARE - Hydrologist (Medical): No    Lack of Transportation (Non-Medical): No  Physical Activity: Insufficiently Active (02/04/2023)   Exercise Vital Sign    Days of Exercise per Week: 1 day    Minutes of Exercise per Session: 30 min  Stress: No Stress Concern Present (02/04/2023)   [place]    Feeling of Stress : Not at all  Social Connections: Moderately Isolated (02/04/2023)   Social Connection and Isolation Panel [NHANES]    Frequency of Communication with Friends and Family: More than three times a week    Frequency of Social Gatherings with Friends and Family: Twice a week    Attends Religious Services: More than 4 times per year    Active Member of [employer] or Organizations: No    Attends Music therapist: Never    Marital Status: Divorced    Tobacco Counseling Counseling given: Not Answered   Clinical Intake:  Pre-visit preparation completed: Yes  Pain : No/denies pain     Nutritional Risks: None Diabetes: No  How often do you need to have someone help you when you read instructions, pamphlets, or other written materials from your doctor or pharmacy?: 1 - Never  Diabetic? no  Interpreter Needed?: No  Information entered by :: C.Ercell Perlman LPN   Activities of Daily Living    02/04/2023   10:36 AM  In your present state of health, do you have any difficulty performing the following activities:  Hearing? 0  Vision? 0  Difficulty concentrating or making decisions? 1  Comment a little  Walking or climbing stairs? 0  Dressing or bathing? 0  Doing errands, shopping? 0  Preparing Food and eating ? N  Using the Toilet? N  In the past six months, have you accidently leaked urine? N  Do you have problems with loss of bowel control? N  Managing your Medications? N  Managing your Finances? N  Housekeeping or managing your Housekeeping? N    Patient Care Team: Michela Pitcher, NP as PCP - General (Pain Medicine)  Indicate any recent Medical Services you may have received from other than [facility] providers in the past year (date may be approximate).     Assessment:   This is a routine wellness examination for [place].  Hearing/Vision screen Hearing Screening - Comments:: No aid Vision  Screening - Comments:: Wears glasses - Pt unsure doctors name  Dietary issues and exercise activities discussed: Current Exercise Habits: The patient does not participate in regular exercise at present, Exercise limited by: None identified   Goals Addressed             This Visit's Progress    Patient Stated       Learn as much of the bible I can.       Depression Screen    02/04/2023   10:38 AM 02/19/2022    1:02 PM 03/08/2021   12:00 PM 05/29/2019  2:05 PM  PHQ 2/9 Scores  PHQ - 2 Score 0 0 2 0  PHQ- 9 Score  5 5 0    Fall Risk    02/04/2023   10:36 AM 03/08/2021   10:06 AM 05/29/2019    2:04 PM  Fall Risk   Falls in the past year? 0 0 0  Number falls in past yr: 0 0 0  Injury with Fall? 0 0 0  Risk for fall due to : No Fall Risks    Follow up Falls prevention discussed;Falls evaluation completed      FALL RISK PREVENTION PERTAINING TO THE HOME:  Any stairs in or around the home? No  If so, are there any without handrails? No  Home free of loose throw rugs in walkways, pet beds, electrical cords, etc? Yes  Adequate lighting in your home to reduce risk of falls? Yes   ASSISTIVE DEVICES UTILIZED TO PREVENT FALLS:  Life alert? No  Use of a cane, walker or w/c? No  Grab bars in the bathroom? Yes  Shower chair or bench in shower? No  Elevated toilet seat or a handicapped toilet? No      Cognitive Function:        02/04/2023   10:39 AM  6CIT Screen  What Year? 0 points  What month? 0 points  What time? 0 points  Count back from 20 0 points  Months in reverse 4 points  Repeat phrase 6 points  Total Score 10 points    Immunizations Immunization History  Administered Date(s) Administered   PFIZER(Purple Top)SARS-COV-2 Vaccination 03/30/2020, 04/20/2020   PNEUMOCOCCAL CONJUGATE-20 03/08/2021    TDAP status: Due, Education has been provided regarding the importance of this vaccine. Advised may receive this vaccine at local pharmacy or Health Dept.  Aware to provide a copy of the vaccination record if obtained from local pharmacy or Health Dept. Verbalized acceptance and understanding.  Flu Vaccine status: Due, Education has been provided regarding the importance of this vaccine. Advised may receive this vaccine at local pharmacy or Health Dept. Aware to provide a copy of the vaccination record if obtained from local pharmacy or Health Dept. Verbalized acceptance and understanding.  Pneumococcal vaccine status: Up to date  Covid-19 vaccine status: Declined, Education has been provided regarding the importance of this vaccine but patient still declined. Advised may receive this vaccine at local pharmacy or Health Dept.or vaccine clinic. Aware to provide a copy of the vaccination record if obtained from local pharmacy or Health Dept. Verbalized acceptance and understanding.  Qualifies for Shingles Vaccine? Yes   Zostavax completed No   Shingrix Completed?: No.    Education has been provided regarding the importance of this vaccine. Patient has been advised to call insurance company to determine out of pocket expense if they have not yet received this vaccine. Advised may also receive vaccine at local pharmacy or Health Dept. Verbalized acceptance and understanding.  Screening Tests Health Maintenance  Topic Date Due   DTaP/Tdap/Td (1 - Tdap) Never done   Zoster Vaccines- Shingrix (1 of 2) Never done   DEXA SCAN  Never done   COVID-19 Vaccine (3 - 2023-24 season) 08/10/2022   INFLUENZA VACCINE  03/10/2023 (Originally 07/10/2022)   Medicare Annual Wellness (AWV)  02/05/2024   Pneumonia Vaccine 56+ Years old  Completed   HPV VACCINES  Aged Out    Health Maintenance  Health Maintenance Due  Topic Date Due   DTaP/Tdap/Td (1 - Tdap) Never done  Zoster Vaccines- Shingrix (1 of 2) Never done   DEXA SCAN  Never done   COVID-19 Vaccine (3 - 2023-24 season) 08/10/2022    Colorectal cancer screening: No longer required.   Mammogram status:  No longer required due to age.  Bone Density status: Ordered today. Pt provided with contact info and advised to call to schedule appt.  Lung Cancer Screening: (Low Dose CT Chest recommended if Age 25-80 years, 30 pack-year currently smoking OR have quit w/in 15years.) does not qualify.   Lung Cancer Screening Referral: no  Additional Screening:  Hepatitis C Screening: does not qualify; Completed no  Vision Screening: Recommended annual ophthalmology exams for early detection of glaucoma and other disorders of the eye. Is the patient up to date with their annual eye exam?  Yes  Who is the provider or what is the name of the office in which the patient attends annual eye exams? Unknown If pt is not established with a provider, would they like to be referred to a provider to establish care? No .   Dental Screening: Recommended annual dental exams for proper oral hygiene  Community Resource Referral / Chronic Care Management: CRR required this visit?  No   CCM required this visit?  No      Plan:     I have personally reviewed and noted the following in the patient's chart:   Medical and social history Use of alcohol, tobacco or illicit drugs  Current medications and supplements including opioid prescriptions. Patient is not currently taking opioid prescriptions. Functional ability and status Nutritional status Physical activity Advanced directives List of other physicians Hospitalizations, surgeries, and ER visits in previous 12 months Vitals Screenings to include cognitive, depression, and falls Referrals and appointments  In addition, I have reviewed and discussed with patient certain preventive protocols, quality metrics, and best practice recommendations. A written personalized care plan for preventive services as well as general preventive health recommendations were provided to patient.     Lebron Conners, LPN   [id number]   Nurse Notes: 6 CIT score was 10.  Order placed for bone scan. Niece is taking pt to pharmacy for immunizations.

## 2023-02-04 NOTE — Patient Instructions (Signed)
Susan Goodman , Thank you for taking time to come for your Medicare Wellness Visit. I appreciate your ongoing commitment to your health goals. Please review the following plan we discussed and let me know if I can assist you in the future.   These are the goals we discussed:  Goals      Patient Stated     Learn as much of the bible I can.        This is a list of the screening recommended for you and due dates:  Health Maintenance  Topic Date Due   DTaP/Tdap/Td vaccine (1 - Tdap) Never done   Zoster (Shingles) Vaccine (1 of 2) Never done   DEXA scan (bone density measurement)  Never done   COVID-19 Vaccine (3 - 2023-24 season) 08/10/2022   Flu Shot  03/10/2023*   Medicare Annual Wellness Visit  02/05/2024   Pneumonia Vaccine  Completed   HPV Vaccine  Aged Out  *Topic was postponed. The date shown is not the original due date.    Advanced directives: requested copy.  Conditions/risks identified: Aim for 30 minutes of exercise or brisk walking, 6-8 glasses of water, and 5 servings of fruits and vegetables each day.   Next appointment: Follow up in one year for your annual wellness visit 02/10/2024 @ 10:15 am via telephone.   Preventive Care 20 Years and Older, Female Preventive care refers to lifestyle choices and visits with your health care provider that can promote health and wellness. What does preventive care include? A yearly physical exam. This is also called an annual well check. Dental exams once or twice a year. Routine eye exams. Ask your health care provider how often you should have your eyes checked. Personal lifestyle choices, including: Daily care of your teeth and gums. Regular physical activity. Eating a healthy diet. Avoiding tobacco and drug use. Limiting alcohol use. Practicing safe sex. Taking low-dose aspirin every day. Taking vitamin and mineral supplements as recommended by your health care provider. What happens during an annual well check? The  services and screenings done by your health care provider during your annual well check will depend on your age, overall health, lifestyle risk factors, and family history of disease. Counseling  Your health care provider may ask you questions about your: Alcohol use. Tobacco use. Drug use. Emotional well-being. Home and relationship well-being. Sexual activity. Eating habits. History of falls. Memory and ability to understand (cognition). Work and work Statistician. Reproductive health. Screening  You may have the following tests or measurements: Height, weight, and BMI. Blood pressure. Lipid and cholesterol levels. These may be checked every 5 years, or more frequently if you are over 9 years old. Skin check. Lung cancer screening. You may have this screening every year starting at age 73 if you have a 30-pack-year history of smoking and currently smoke or have quit within the past 15 years. Fecal occult blood test (FOBT) of the stool. You may have this test every year starting at age 40. Flexible sigmoidoscopy or colonoscopy. You may have a sigmoidoscopy every 5 years or a colonoscopy every 10 years starting at age 82. Hepatitis C blood test. Hepatitis B blood test. Sexually transmitted disease (STD) testing. Diabetes screening. This is done by checking your blood sugar (glucose) after you have not eaten for a while (fasting). You may have this done every 1-3 years. Bone density scan. This is done to screen for osteoporosis. You may have this done starting at age 47. Mammogram. This may  be done every 1-2 years. Talk to your health care provider about how often you should have regular mammograms. Talk with your health care provider about your test results, treatment options, and if necessary, the need for more tests. Vaccines  Your health care provider may recommend certain vaccines, such as: Influenza vaccine. This is recommended every year. Tetanus, diphtheria, and acellular  pertussis (Tdap, Td) vaccine. You may need a Td booster every 10 years. Zoster vaccine. You may need this after age 37. Pneumococcal 13-valent conjugate (PCV13) vaccine. One dose is recommended after age 58. Pneumococcal polysaccharide (PPSV23) vaccine. One dose is recommended after age 8. Talk to your health care provider about which screenings and vaccines you need and how often you need them. This information is not intended to replace advice given to you by your health care provider. Make sure you discuss any questions you have with your health care provider. Document Released: 12/23/2015 Document Revised: 08/15/2016 Document Reviewed: 09/27/2015 Elsevier Interactive Patient Education  2017 [place] Prevention in the Home Falls can cause injuries. They can happen to people of all ages. There are many things you can do to make your home safe and to help prevent falls. What can I do on the outside of my home? Regularly fix the edges of walkways and driveways and fix any cracks. Remove anything that might make you trip as you walk through a door, such as a raised step or threshold. Trim any bushes or trees on the path to your home. Use bright outdoor lighting. Clear any walking paths of anything that might make someone trip, such as rocks or tools. Regularly check to see if handrails are loose or broken. Make sure that both sides of any steps have handrails. Any raised decks and porches should have guardrails on the edges. Have any leaves, snow, or ice cleared regularly. Use sand or salt on walking paths during winter. Clean up any spills in your garage right away. This includes oil or grease spills. What can I do in the bathroom? Use night lights. Install grab bars by the toilet and in the tub and shower. Do not use towel bars as grab bars. Use non-skid mats or decals in the tub or shower. If you need to sit down in the shower, use a plastic, non-slip stool. Keep the floor  dry. Clean up any water that spills on the floor as soon as it happens. Remove soap buildup in the tub or shower regularly. Attach bath mats securely with double-sided non-slip rug tape. Do not have throw rugs and other things on the floor that can make you trip. What can I do in the bedroom? Use night lights. Make sure that you have a light by your bed that is easy to reach. Do not use any sheets or blankets that are too big for your bed. They should not hang down onto the floor. Have a firm chair that has side arms. You can use this for support while you get dressed. Do not have throw rugs and other things on the floor that can make you trip. What can I do in the kitchen? Clean up any spills right away. Avoid walking on wet floors. Keep items that you use a lot in easy-to-reach places. If you need to reach something above you, use a strong step stool that has a grab bar. Keep electrical cords out of the way. Do not use floor polish or wax that makes floors slippery. If you must use  wax, use non-skid floor wax. Do not have throw rugs and other things on the floor that can make you trip. What can I do with my stairs? Do not leave any items on the stairs. Make sure that there are handrails on both sides of the stairs and use them. Fix handrails that are broken or loose. Make sure that handrails are as long as the stairways. Check any carpeting to make sure that it is firmly attached to the stairs. Fix any carpet that is loose or worn. Avoid having throw rugs at the top or bottom of the stairs. If you do have throw rugs, attach them to the floor with carpet tape. Make sure that you have a light switch at the top of the stairs and the bottom of the stairs. If you do not have them, ask someone to add them for you. What else can I do to help prevent falls? Wear shoes that: Do not have high heels. Have rubber bottoms. Are comfortable and fit you well. Are closed at the toe. Do not wear  sandals. If you use a stepladder: Make sure that it is fully opened. Do not climb a closed stepladder. Make sure that both sides of the stepladder are locked into place. Ask someone to hold it for you, if possible. Clearly mark and make sure that you can see: Any grab bars or handrails. First and last steps. Where the edge of each step is. Use tools that help you move around (mobility aids) if they are needed. These include: Canes. Walkers. Scooters. Crutches. Turn on the lights when you go into a dark area. Replace any light bulbs as soon as they burn out. Set up your furniture so you have a clear path. Avoid moving your furniture around. If any of your floors are uneven, fix them. If there are any pets around you, be aware of where they are. Review your medicines with your doctor. Some medicines can make you feel dizzy. This can increase your chance of falling. Ask your doctor what other things that you can do to help prevent falls. This information is not intended to replace advice given to you by your health care provider. Make sure you discuss any questions you have with your health care provider. Document Released: 09/22/2009 Document Revised: 05/03/2016 Document Reviewed: 12/31/2014 Elsevier Interactive Patient Education  2017 [employer].

## 2023-06-03 ENCOUNTER — Encounter: Payer: Self-pay | Admitting: Nurse Practitioner

## 2023-06-03 ENCOUNTER — Ambulatory Visit (INDEPENDENT_AMBULATORY_CARE_PROVIDER_SITE_OTHER): Payer: Medicare (Managed Care) | Admitting: Nurse Practitioner

## 2023-06-03 VITALS — BP 138/72 | HR 76 | Temp 98.4°F | Resp 16 | Ht 61.5 in | Wt 176.0 lb

## 2023-06-03 DIAGNOSIS — Z0000 Encounter for general adult medical examination without abnormal findings: Secondary | ICD-10-CM | POA: Diagnosis not present

## 2023-06-03 DIAGNOSIS — R202 Paresthesia of skin: Secondary | ICD-10-CM

## 2023-06-03 DIAGNOSIS — R09A2 Foreign body sensation, throat: Secondary | ICD-10-CM | POA: Diagnosis not present

## 2023-06-03 DIAGNOSIS — E7800 Pure hypercholesterolemia, unspecified: Secondary | ICD-10-CM | POA: Diagnosis not present

## 2023-06-03 DIAGNOSIS — G4489 Other headache syndrome: Secondary | ICD-10-CM

## 2023-06-03 DIAGNOSIS — Z1231 Encounter for screening mammogram for malignant neoplasm of breast: Secondary | ICD-10-CM

## 2023-06-03 LAB — CBC
HCT: 40.4 % (ref 36.0–46.0)
Hemoglobin: 13.2 g/dL (ref 12.0–15.0)
MCHC: 32.8 g/dL (ref 30.0–36.0)
MCV: 90.9 fl (ref 78.0–100.0)
Platelets: 244 10*3/uL (ref 150.0–400.0)
RBC: 4.44 Mil/uL (ref 3.87–5.11)
RDW: 14 % (ref 11.5–15.5)
WBC: 4.4 10*3/uL (ref 4.0–10.5)

## 2023-06-03 LAB — LIPID PANEL
Cholesterol: 255 mg/dL — ABNORMAL HIGH (ref 0–200)
HDL: 91.6 mg/dL (ref >39.00)
LDL Cholesterol: 155 mg/dL — ABNORMAL HIGH (ref 0–99)
NonHDL: 163.66
Total CHOL/HDL Ratio: 3
Triglycerides: 43 mg/dL (ref 0.0–149.0)
VLDL: 8.6 mg/dL (ref 0.0–40.0)

## 2023-06-03 LAB — COMPREHENSIVE METABOLIC PANEL
ALT: 13 U/L (ref 0–35)
AST: 21 U/L (ref 0–37)
Albumin: 4.3 g/dL (ref 3.5–5.2)
Alkaline Phosphatase: 81 U/L (ref 39–117)
BUN: 12 mg/dL (ref 6–23)
CO2: 29 mEq/L (ref 19–32)
Calcium: 9.9 mg/dL (ref 8.4–10.5)
Chloride: 103 mEq/L (ref 96–112)
Creatinine, Ser: 0.81 mg/dL (ref 0.40–1.20)
GFR: 67.91 mL/min (ref >60.00)
Glucose, Bld: 99 mg/dL (ref 70–99)
Potassium: 4.9 mEq/L (ref 3.5–5.1)
Sodium: 141 mEq/L (ref 135–145)
Total Bilirubin: 0.6 mg/dL (ref 0.2–1.2)
Total Protein: 7.3 g/dL (ref 6.0–8.3)

## 2023-06-03 LAB — TSH: TSH: 0.67 u[IU]/mL (ref 0.35–5.50)

## 2023-06-03 LAB — VITAMIN B12: Vitamin B-12: 430 pg/mL (ref 211–911)

## 2023-06-03 MED ORDER — FLUTICASONE PROPIONATE 50 MCG/ACT NA SUSP
2.0000 | Freq: Every day | NASAL | 0 refills | Status: AC
Start: 2023-06-03 — End: ?

## 2023-06-03 NOTE — Assessment & Plan Note (Signed)
Bilateral lower extremities check B12 along with electrolytes

## 2023-06-03 NOTE — Assessment & Plan Note (Signed)
Discussed age-appropriate immunizations and screening exams.  Did review patient's personal, surgical, social, family histories.  Patient is up-to-date on all age-appropriate vaccinations.  Did recommend to get shingles vaccine at local pharmacy.  Patient is up-to-date on CRC screening with no recommended recall due to patient's age.  DEXA scan and mammogram orders are placed and information given to patient to call and get this scheduled.  No longer does cervical cancer screening due to age.  Patient was given information at discharge about preventative healthcare maintenance with anticipatory guidance.

## 2023-06-03 NOTE — Assessment & Plan Note (Signed)
History of the same.  Pending lipid panel today 

## 2023-06-03 NOTE — Assessment & Plan Note (Signed)
Will start patient on all fluticasone nasal spray.  If no improvement consider doing PPI for 1 month

## 2023-06-03 NOTE — Assessment & Plan Note (Signed)
Atypical headache.  Work on hydration consider eye exam.  Pending basic labs neurological exam benign in office

## 2023-06-03 NOTE — Patient Instructions (Signed)
Nice to see you today I will be in touch with the labs once I have reviewed them Follow up with me 1 year for your next physical, sooner if you need me Call and get the bone scan and mammogram scheduled

## 2023-06-03 NOTE — Progress Notes (Signed)
Established Patient Office Visit  Subjective   Patient ID: Susan Goodman, female    DOB: 09/22/1941  Age: 82 y.o. MRN: [id number]  Chief Complaint  Patient presents with   Annual Exam    HPI  for complete physical and follow up of chronic conditions.  Immunizations: -Tetanus: Unsure  -Influenza: out of season  -Shingles: discussed in office. Get at local pharmacy -Pneumonia: Completed   Diet: Fair diet. Depends on her mood. She cooks and eats. Water and ginger ale  Exercise: No regular exercise. Sometimes se will walk  Eye exam: needs updating. States that she has a hx of cataracts. Reading glasses Dental exam: Completes semi-annually    Colonoscopy: Completed in 2020, with no recommended follow up  Lung Cancer Screening: NA  Pap smear: aged out  Dexa: order in needs to complete. Is on vitamin D  Mammogram: Needs updating.  [facility]  Sleep: states that she gets 5-6 hours of sleep. Most of the time she feels rested  Headahce: states it feels like she gets hit on the top of the head. States that it is not daily and varies in duration. No visual changes. No OTC medication. States a sharp pain on the top of the head that does not last all day        Review of Systems  Constitutional:  Negative for chills and fever.  Respiratory:  Negative for shortness of breath.   Cardiovascular:  Negative for chest pain and leg swelling.  Gastrointestinal:  Negative for abdominal pain, blood in stool, constipation, diarrhea, nausea and vomiting.       BM daily   Genitourinary:  Negative for dysuria and hematuria.  Neurological:  Positive for tingling. Negative for headaches.  Psychiatric/Behavioral:  Negative for hallucinations and suicidal ideas.       Objective:     BP 138/72   Pulse 76   Temp 98.4 F (36.9 C)   Resp 16   Ht 5' 1.5" (1.562 m)   Wt 176 lb (79.8 kg)   SpO2 97%   BMI 32.72 kg/m  BP Readings from Last 3 Encounters:  06/03/23 138/72  10/15/22  106/62  02/19/22 128/86   Wt Readings from Last 3 Encounters:  06/03/23 176 lb (79.8 kg)  02/04/23 164 lb (74.4 kg)  10/15/22 164 lb 8 oz (74.6 kg)      Physical Exam Vitals and nursing note reviewed.  Constitutional:      Appearance: Normal appearance.  HENT:     Right Ear: Tympanic membrane, ear canal and external ear normal.     Left Ear: Tympanic membrane, ear canal and external ear normal.     Mouth/Throat:     Mouth: Mucous membranes are moist.     Pharynx: Oropharynx is clear.  Eyes:     Extraocular Movements: Extraocular movements intact.     Pupils: Pupils are equal, round, and reactive to light.  Cardiovascular:     Rate and Rhythm: Normal rate and regular rhythm.     Pulses: Normal pulses.     Heart sounds: Normal heart sounds.  Pulmonary:     Effort: Pulmonary effort is normal.     Breath sounds: Normal breath sounds.  Abdominal:     General: Bowel sounds are normal. There is no distension.     Palpations: There is no mass.     Tenderness: There is no abdominal tenderness.     Hernia: No hernia is present.  Musculoskeletal:     Right  lower leg: No edema.     Left lower leg: No edema.  Lymphadenopathy:     Cervical: No cervical adenopathy.  Skin:    General: Skin is warm.  Neurological:     General: No focal deficit present.     Mental Status: She is alert.     Deep Tendon Reflexes:     Reflex Scores:      Bicep reflexes are 2+ on the right side and 2+ on the left side.      Patellar reflexes are 2+ on the right side and 2+ on the left side.    Comments: Bilateral upper and lower extremity strength 5/5  Psychiatric:        Mood and Affect: Mood normal.        Behavior: Behavior normal.        Thought Content: Thought content normal.        Judgment: Judgment normal.      No results found for any visits on 06/03/23.    The ASCVD Risk score (Arnett DK, et al., 2019) failed to calculate for the following reasons:   The 2019 ASCVD risk score is  only valid for ages 68 to 69    Assessment & Plan:   Problem List Items Addressed This Visit       Other   Preventative health care    Discussed age-appropriate immunizations and screening exams.  Did review patient's personal, surgical, social, family histories.  Patient is up-to-date on all age-appropriate vaccinations.  Did recommend to get shingles vaccine at local pharmacy.  Patient is up-to-date on CRC screening with no recommended recall due to patient's age.  DEXA scan and mammogram orders are placed and information given to patient to call and get this scheduled.  No longer does cervical cancer screening due to age.  Patient was given information at discharge about preventative healthcare maintenance with anticipatory guidance.      Relevant Orders   CBC   Comprehensive metabolic panel   Pure hypercholesterolemia    History of the same.  Pending lipid panel today      Relevant Orders   Lipid panel   Globus sensation - Primary    Will start patient on all fluticasone nasal spray.  If no improvement consider doing PPI for 1 month      Relevant Medications   fluticasone (FLONASE) 50 MCG/ACT nasal spray   Other headache syndrome    Atypical headache.  Work on hydration consider eye exam.  Pending basic labs neurological exam benign in office      Paresthesia    Bilateral lower extremities check B12 along with electrolytes      Relevant Orders   TSH   Vitamin B12   Other Visit Diagnoses     Screening mammogram for breast cancer       Relevant Orders   MM 3D SCREENING MAMMOGRAM BILATERAL BREAST       Return in about 1 year (around 06/02/2024) for CPE and Labs.    Audria Nine, NP

## 2023-06-11 ENCOUNTER — Telehealth: Payer: Self-pay

## 2023-06-11 NOTE — Telephone Encounter (Signed)
Letter mailed with most recent lab results and matts comments.

## 2023-06-25 ENCOUNTER — Other Ambulatory Visit: Payer: Self-pay | Admitting: Nurse Practitioner

## 2023-06-25 DIAGNOSIS — R09A2 Foreign body sensation, throat: Secondary | ICD-10-CM

## 2023-07-01 ENCOUNTER — Telehealth: Payer: Self-pay | Admitting: Nurse Practitioner

## 2023-07-01 NOTE — Telephone Encounter (Signed)
Patient would like to know if an order could be sent out for her to have a bone density test? At her cpe on 06/03/2023 it was discussed however the order wasn't sent out.

## 2023-07-01 NOTE — Telephone Encounter (Signed)
Left detailed voicemail *per FYI*  for patient in regards to the Bone density test. Stated for patient to call the office back if she has any questions or concerns.

## 2023-08-05 ENCOUNTER — Ambulatory Visit
Admission: RE | Admit: 2023-08-05 | Discharge: 2023-08-05 | Disposition: A | Discharge status: Home / Self Care | Payer: Medicare (Managed Care) | Source: Ambulatory Visit | Attending: Nurse Practitioner | Admitting: Nurse Practitioner

## 2023-08-05 ENCOUNTER — Inpatient Hospital Stay: Admission: RE | Admit: 2023-08-05 | Payer: Medicare (Managed Care) | Source: Ambulatory Visit

## 2023-08-05 DIAGNOSIS — Z1231 Encounter for screening mammogram for malignant neoplasm of breast: Secondary | ICD-10-CM | POA: Diagnosis not present

## 2023-08-30 ENCOUNTER — Ambulatory Visit
Admission: RE | Admit: 2023-08-30 | Discharge: 2023-08-30 | Disposition: A | Discharge status: Home / Self Care | Payer: Medicare (Managed Care) | Source: Ambulatory Visit | Attending: Nurse Practitioner | Admitting: Nurse Practitioner

## 2023-08-30 DIAGNOSIS — Z780 Asymptomatic menopausal state: Secondary | ICD-10-CM | POA: Insufficient documentation

## 2023-08-30 DIAGNOSIS — M8589 Other specified disorders of bone density and structure, multiple sites: Secondary | ICD-10-CM | POA: Diagnosis not present

## 2024-01-13 ENCOUNTER — Ambulatory Visit: Payer: Self-pay | Admitting: Nurse Practitioner

## 2024-01-13 ENCOUNTER — Ambulatory Visit: Payer: Medicare HMO | Admitting: Internal Medicine

## 2024-01-13 ENCOUNTER — Encounter: Payer: Self-pay | Admitting: Internal Medicine

## 2024-01-13 VITALS — BP 104/68 | HR 87 | Temp 99.0°F | Ht 61.5 in | Wt 177.0 lb

## 2024-01-13 DIAGNOSIS — R209 Unspecified disturbances of skin sensation: Secondary | ICD-10-CM | POA: Diagnosis not present

## 2024-01-13 NOTE — Telephone Encounter (Signed)
  Chief Complaint: bilateral foot pain Symptoms: burning in both feet, mild edema around ankles, discoloration Frequency: worsening this last week Pertinent Negatives: Patient denies CP, SOB, redness, warmth Disposition: [] ED /[] Urgent Care (no appt availability in office) / [x] Appointment(In office/virtual)/ []  [facility]/ [] Home Care/ [] Refused Recommended Disposition /[] [facility]/ []  Follow-up with PCP Additional Notes: Patient called reporting bilateral lower extremity burning that has worsened over the last week. Patient states she has mild swelling around both ankles. Per protocol, patient to be evaluated within 3 days. Offered appt with PCP for tmw, patient states she needs appt for today because she does not have a ride tomorrow. Patient scheduled with first available provider for today at 1530 at patient request. Care advice reviewed, patient verbalized understanding. Alerting PCP for review.   Copied from CRM [id number]. Topic: Clinical - Medical Advice >> Jan 13, 2024 11:53 AM Louie Casa B wrote: Reason for CRM: patient is having burning around her ankle and wants her dr to call her Reason for Disposition  [1] MODERATE pain (e.g., interferes with normal activities, limping) AND [2] present > 3 days  Answer Assessment - Initial Assessment Questions 1. ONSET: "When did the pain start?"      Described as burning, for several years reports it is the same. 2. LOCATION: "Where is the pain located?"      Both ankles and feet 3. PAIN: "How bad is the pain?"    (Scale 1-10; or mild, moderate, severe)   -  MILD (1-3): doesn't interfere with normal activities    -  MODERATE (4-7): interferes with normal activities (e.g., work or school) or awakens from sleep, limping    -  SEVERE (8-10): excruciating pain, unable to do any normal activities, unable to walk- states is painful to touch.     Unable to quantify numerically, mild- does not interfere with normal activity. 4. WORK  OR EXERCISE: "Has there been any recent work or exercise that involved this part of the body?"      Denies 5. CAUSE: "What do you think is causing the leg pain?"     Unsure 6. OTHER SYMPTOMS: "Do you have any other symptoms?" (e.g., chest pain, back pain, breathing difficulty, swelling, rash, fever, numbness, weakness)     Mild swelling around ankles  Protocols used: Leg Pain-A-AH

## 2024-01-13 NOTE — Progress Notes (Signed)
Subjective:    Patient ID: Susan Goodman, female    DOB: 1941-12-10, 83 y.o.   MRN: [id number]  HPI Here due to burning in her feet With niece  Going about 3 years--since moving here from [facility] stinging/burning sensation--started in calves Now in ankles also Annoying  Walks okay Not much swelling Better if she crosses her legs Able to sleep  No chest pain or SOB  Tried putting tea on it (per a friend)----no real help or helped briefly  No worsening or pain when walking  Current Outpatient Medications on File Prior to Visit  Medication Sig Dispense Refill   Ascorbic Acid (VITAMIN C) 100 MG tablet Take 100 mg by mouth daily.     Cholecalciferol (D3-1000 PO) Take by mouth.     fluticasone (FLONASE) 50 MCG/ACT nasal spray Place 2 sprays into both nostrils daily. 16 g 0   loratadine (CLARITIN) 10 MG tablet Take 10 mg by mouth daily.     No current facility-administered medications on file prior to visit.    No Known Allergies  Past Medical History:  Diagnosis Date   Anxiety    Arthritis    Cataract    GERD (gastroesophageal reflux disease)    History of colon polyps     Past Surgical History:  Procedure Laterality Date   CATARACT EXTRACTION Bilateral    COLONOSCOPY  2010   In [place]    OVARIAN CYST REMOVAL     1980's     Family History  Problem Relation Age of Onset   Arthritis Mother    Diabetes Mother    Alzheimer's disease Mother    Hypertension Father    Diabetes Father    Heart disease Father        triple bypass   Colon cancer Father    Kidney Stones Father    Lung cancer Sister    Diabetes Sister    Diabetes Sister    Diabetes Sister    Heart attack Brother    Lung cancer Brother    Breast cancer Maternal Grandmother    Esophageal cancer Neg Hx    Rectal cancer Neg Hx    Stomach cancer Neg Hx     Social History   Socioeconomic History   Marital status: Divorced    Spouse name: Not on file   Number of children: Not  on file   Years of education: Not on file   Highest education level: Not on file  Occupational History   Occupation: Retired  Tobacco Use   Smoking status: Never   Smokeless tobacco: Never  Vaping Use   Vaping status: Never Used  Substance and Sexual Activity   Alcohol use: Never   Drug use: Never   Sexual activity: Not on file  Other Topics Concern   Not on file  Social History Narrative   Retired.      Hobbies": projects with church :   Social Drivers of Health   Financial Resource Strain: Low Risk  (02/04/2023)   Overall Financial Resource Strain (CARDIA)    Difficulty of Paying Living Expenses: Not hard at all  Food Insecurity: No Food Insecurity (02/04/2023)   Hunger Vital Sign    Worried About Running Out of Food in the Last Year: Never true    Ran Out of Food in the Last Year: Never true  Transportation Needs: No Transportation Needs (02/04/2023)   PRAPARE - Administrator, Civil Service (Medical): No  Lack of Transportation (Non-Medical): No  Physical Activity: Insufficiently Active (02/04/2023)   Exercise Vital Sign    Days of Exercise per Week: 1 day    Minutes of Exercise per Session: 30 min  Stress: No Stress Concern Present (02/04/2023)   [employer] of Occupational Health - Occupational Stress Questionnaire    Feeling of Stress : Not at all  Social Connections: Moderately Isolated (02/04/2023)   Social Connection and Isolation Panel [NHANES]    Frequency of Communication with Friends and Family: More than three times a week    Frequency of Social Gatherings with Friends and Family: Twice a week    Attends Religious Services: More than 4 times per year    Active Member of [employer] or Organizations: No    Attends Banker Meetings: Never    Marital Status: Divorced  Catering manager Violence: Not At Risk (02/04/2023)   Humiliation, Afraid, Rape, and Kick questionnaire    Fear of Current or Ex-Partner: No    Emotionally Abused: No     Physically Abused: No    Sexually Abused: No   Review of Systems Appetite is fine Weight is stable    Objective:   Physical Exam Constitutional:      Appearance: Normal appearance.  Cardiovascular:     Comments: Faint pulse on left---seems absent on right warm Musculoskeletal:     Right lower leg: No edema.     Left lower leg: No edema.     Comments: No calf tenderness  Neurological:     Mental Status: She is alert.     Comments: Fairly normal sensation in calves and feet            Assessment & Plan:

## 2024-01-13 NOTE — Assessment & Plan Note (Signed)
Seems most like neuropathy---will check labs Pulses are diminished---not most consistent with circulatory issue--but will check ABIs

## 2024-01-13 NOTE — Telephone Encounter (Signed)
 Noted

## 2024-01-14 ENCOUNTER — Other Ambulatory Visit: Payer: Self-pay | Admitting: Internal Medicine

## 2024-01-14 DIAGNOSIS — E538 Deficiency of other specified B group vitamins: Secondary | ICD-10-CM

## 2024-01-14 LAB — CBC
HCT: 38.7 % (ref 36.0–46.0)
Hemoglobin: 13 g/dL (ref 12.0–15.0)
MCHC: 33.7 g/dL (ref 30.0–36.0)
MCV: 91.3 fL (ref 78.0–100.0)
Platelets: 228 10*3/uL (ref 150.0–400.0)
RBC: 4.24 Mil/uL (ref 3.87–5.11)
RDW: 13.1 % (ref 11.5–15.5)
WBC: 5 10*3/uL (ref 4.0–10.5)

## 2024-01-14 LAB — COMPREHENSIVE METABOLIC PANEL
ALT: 10 U/L (ref 0–35)
AST: 19 U/L (ref 0–37)
Albumin: 4.2 g/dL (ref 3.5–5.2)
Alkaline Phosphatase: 83 U/L (ref 39–117)
BUN: 10 mg/dL (ref 6–23)
CO2: 31 meq/L (ref 19–32)
Calcium: 9.1 mg/dL (ref 8.4–10.5)
Chloride: 103 meq/L (ref 96–112)
Creatinine, Ser: 0.95 mg/dL (ref 0.40–1.20)
GFR: 55.84 mL/min — ABNORMAL LOW (ref >60.00)
Glucose, Bld: 108 mg/dL — ABNORMAL HIGH (ref 70–99)
Potassium: 4.3 meq/L (ref 3.5–5.1)
Sodium: 141 meq/L (ref 135–145)
Total Bilirubin: 0.4 mg/dL (ref 0.2–1.2)
Total Protein: 6.8 g/dL (ref 6.0–8.3)

## 2024-01-14 LAB — TSH: TSH: 0.83 u[IU]/mL (ref 0.35–5.50)

## 2024-01-14 LAB — VITAMIN B12: Vitamin B-12: 191 pg/mL — ABNORMAL LOW (ref 211–911)

## 2024-01-15 LAB — PROTEIN ELECTROPHORESIS, SERUM, WITH REFLEX
Albumin ELP: 4 g/dL (ref 3.8–4.8)
Alpha 1: 0.3 g/dL (ref 0.2–0.3)
Alpha 2: 0.7 g/dL (ref 0.5–0.9)
Beta 2: 0.3 g/dL (ref 0.2–0.5)
Beta Globulin: 0.4 g/dL (ref 0.4–0.6)
Gamma Globulin: 1 g/dL (ref 0.8–1.7)
Total Protein: 6.6 g/dL (ref 6.1–8.1)

## 2024-01-16 ENCOUNTER — Ambulatory Visit: Payer: Medicare HMO | Attending: Internal Medicine

## 2024-01-16 DIAGNOSIS — R208 Other disturbances of skin sensation: Secondary | ICD-10-CM | POA: Diagnosis not present

## 2024-01-16 DIAGNOSIS — R0989 Other specified symptoms and signs involving the circulatory and respiratory systems: Secondary | ICD-10-CM | POA: Diagnosis not present

## 2024-01-16 DIAGNOSIS — R209 Unspecified disturbances of skin sensation: Secondary | ICD-10-CM

## 2024-01-17 LAB — VAS US LOWER EXT ART SEG MULTI (SEGMENTALS & LE RAYNAUDS)
Left ABI: 1.11
Right ABI: 1.15

## 2024-01-20 ENCOUNTER — Telehealth: Payer: Self-pay | Admitting: Nurse Practitioner

## 2024-01-20 NOTE — Telephone Encounter (Signed)
 Copied from CRM [id number]. Topic: Clinical - Lab/Test Results >> Jan 20, 2024  9:39 AM Jayson Michael wrote: Reason for CRM: patient wants to discuss lab results has some questions regarding levels, please call patient back at [phone]

## 2024-01-20 NOTE — Telephone Encounter (Signed)
 Called patient reviewed all information and repeated back to me. Will call if any questions.   Spoke with pt's niece.   Pt's niece stated that the pt needs to be seen in office for anxiety problems and memory issues. Advised to pt that the earliest appt was for tomorrow and then in march. Pt niece declined available slot for tomorrow.   admin Pt's niece requested to be put on a cancellations list?  Pt's niece states to give her a call if anything sooner pops up. States she can only do Monday appointments.   Scheduled pt for OV and lab work for 3/10. @940AM .

## 2024-02-10 ENCOUNTER — Ambulatory Visit (INDEPENDENT_AMBULATORY_CARE_PROVIDER_SITE_OTHER): Payer: Medicare HMO

## 2024-02-10 VITALS — Ht 61.5 in | Wt 167.0 lb

## 2024-02-10 DIAGNOSIS — Z0000 Encounter for general adult medical examination without abnormal findings: Secondary | ICD-10-CM | POA: Diagnosis not present

## 2024-02-10 DIAGNOSIS — H9190 Unspecified hearing loss, unspecified ear: Secondary | ICD-10-CM | POA: Diagnosis not present

## 2024-02-10 DIAGNOSIS — H547 Unspecified visual loss: Secondary | ICD-10-CM

## 2024-02-10 NOTE — Progress Notes (Signed)
 Subjective:   Susan Goodman is a 83 y.o. who presents for a Medicare Wellness preventive visit.  Visit Complete: Virtual I connected with  Susan Goodman on 02/10/24 by a audio enabled telemedicine application and verified that I am speaking with the correct person using two identifiers.  Patient Location: Home  Provider Location: Office/Clinic  I discussed the limitations of evaluation and management by telemedicine. The patient expressed understanding and agreed to proceed.  Vital Signs: Because this visit was a virtual/telehealth visit, some criteria may be missing or patient reported. Any vitals not documented were not able to be obtained and vitals that have been documented are patient reported.  VideoDeclined- This patient declined Librarian, academic. Therefore the visit was completed with audio only.  AWV Questionnaire: No: Patient Medicare AWV questionnaire was not completed prior to this visit.  Cardiac Risk Factors include: advanced age (>102men, >39 women);sedentary lifestyle;obesity (BMI >30kg/m2)     Objective:    Today's Vitals   02/10/24 1023 02/10/24 1024  Weight: 167 lb (75.8 kg)   Height: 5' 1.5" (1.562 m)   PainSc:  5    Body mass index is 31.04 kg/m.     02/10/2024   10:44 AM 02/04/2023   10:34 AM  Advanced Directives  Does Patient Have a Medical Advance Directive? Yes Yes  Type of Estate agent of [place];Living will Healthcare Power of [place];Living will  Copy of Healthcare Power of Attorney in Chart? No - copy requested No - copy requested    Current Medications (verified) Outpatient Encounter Medications as of 02/10/2024  Medication Sig   fluticasone (FLONASE) 50 MCG/ACT nasal spray Place 2 sprays into both nostrils daily.   loratadine (CLARITIN) 10 MG tablet Take 10 mg by mouth daily.   Ascorbic Acid (VITAMIN C) 100 MG tablet Take 100 mg by mouth daily.   Cholecalciferol  (D3-1000 PO) Take by mouth.   No facility-administered encounter medications on file as of 02/10/2024.    Allergies (verified) Patient has no known allergies.   History: Past Medical History:  Diagnosis Date   Anxiety    Arthritis    Cataract    GERD (gastroesophageal reflux disease)    History of colon polyps    Past Surgical History:  Procedure Laterality Date   CATARACT EXTRACTION Bilateral    COLONOSCOPY  2010   In [place]    OVARIAN CYST REMOVAL     1980's    Family History  Problem Relation Age of Onset   Arthritis Mother    Diabetes Mother    Alzheimer's disease Mother    Hypertension Father    Diabetes Father    Heart disease Father        triple bypass   Colon cancer Father    Kidney Stones Father    Lung cancer Sister    Diabetes Sister    Diabetes Sister    Diabetes Sister    Heart attack Brother    Lung cancer Brother    Breast cancer Maternal Grandmother    Esophageal cancer Neg Hx    Rectal cancer Neg Hx    Stomach cancer Neg Hx    Social History   Socioeconomic History   Marital status: Divorced    Spouse name: Not on file   Number of children: Not on file   Years of education: Not on file   Highest education level: Not on file  Occupational History   Occupation: Retired  Tobacco Use  Smoking status: Never   Smokeless tobacco: Never  Vaping Use   Vaping status: Never Used  Substance and Sexual Activity   Alcohol use: Never   Drug use: Never   Sexual activity: Not on file  Other Topics Concern   Not on file  Social History Narrative   Retired.      Hobbies": projects with church :   Social Drivers of Health   Financial Resource Strain: Low Risk  (02/10/2024)   Overall Financial Resource Strain (CARDIA)    Difficulty of Paying Living Expenses: Not hard at all  Food Insecurity: No Food Insecurity (02/10/2024)   Hunger Vital Sign    Worried About Running Out of Food in the Last Year: Never true    Ran Out of Food in the Last Year:  Never true  Transportation Needs: No Transportation Needs (02/10/2024)   PRAPARE - Administrator, Civil Service (Medical): No    Lack of Transportation (Non-Medical): No  Physical Activity: Insufficiently Active (02/10/2024)   Exercise Vital Sign    Days of Exercise per Week: 2 days    Minutes of Exercise per Session: 30 min  Stress: Stress Concern Present (02/10/2024)   [employer] of Occupational Health - Occupational Stress Questionnaire    Feeling of Stress : To some extent  Social Connections: Socially Isolated (02/10/2024)   Social Connection and Isolation Panel [NHANES]    Frequency of Communication with Friends and Family: Twice a week    Frequency of Social Gatherings with Friends and Family: Never    Attends Religious Services: Never    Diplomatic Services operational officer: No    Attends Engineer, structural: Never    Marital Status: Divorced    Tobacco Counseling Counseling given: Not Answered    Clinical Intake:  Pre-visit preparation completed: Yes  Pain : 0-10 Pain Score: 5  Pain Type: Chronic pain Pain Location: Leg (both legs) Pain Descriptors / Indicators: Aching Pain Onset: More than a month ago Pain Frequency: Intermittent Pain Relieving Factors: B12  Pain Relieving Factors: B12  BMI - recorded: 31.04 Nutritional Status: BMI > 30  Obese Nutritional Risks: None Diabetes: No  How often do you need to have someone help you when you read instructions, pamphlets, or other written materials from your doctor or pharmacy?: 1 - Never  Interpreter Needed?: No  Comments: lives alone Information entered by :: Susan Shifflett,LPN   Activities of Daily Living     02/10/2024   10:39 AM  In your present state of health, do you have any difficulty performing the following activities:  Hearing? 1  Vision? 1  Difficulty concentrating or making decisions? 1  Walking or climbing stairs? 0  Dressing or bathing? 0  Doing errands, shopping? 1   Preparing Food and eating ? N  Using the Toilet? N  In the past six months, have you accidently leaked urine? N  Do you have problems with loss of bowel control? N  Managing your Medications? N  Managing your Finances? N  Housekeeping or managing your Housekeeping? N    Patient Care Team: Eden Emms, NP as PCP - General (Pain Medicine)  Indicate any recent Medical Services you may have received from other than [facility] providers in the past year (date may be approximate).     Assessment:   This is a routine wellness examination for [place].  Hearing/Vision screen Hearing Screening - Comments:: Pt says her hearing is not good:needs checking Audiology Referral Vision  Screening - Comments:: Pt says needs her eyes checked;wears readers; Dr Hulen Luster   Goals Addressed             This Visit's Progress    Patient Stated       I would like to walk more and enjoy my family       Depression Screen     02/10/2024   10:36 AM 06/03/2023   11:34 AM 02/04/2023   10:38 AM 02/19/2022    1:02 PM 03/08/2021   12:00 PM 05/29/2019    2:05 PM  PHQ 2/9 Scores  PHQ - 2 Score 0 0 0 0 2 0  PHQ- 9 Score  0  5 5 0    Fall Risk     02/10/2024   10:32 AM 06/03/2023   11:34 AM 02/04/2023   10:36 AM 03/08/2021   10:06 AM 05/29/2019    2:04 PM  Fall Risk   Falls in the past year? 0 0 0 0 0  Number falls in past yr: 0 0 0 0 0  Injury with Fall? 0 0 0 0 0  Risk for fall due to : No Fall Risks No Fall Risks No Fall Risks    Follow up Education provided;Falls prevention discussed Falls evaluation completed Falls prevention discussed;Falls evaluation completed      MEDICARE RISK AT HOME:  Medicare Risk at Home Any stairs in or around the home?: No If so, are there any without handrails?: No Home free of loose throw rugs in walkways, pet beds, electrical cords, etc?: Yes Adequate lighting in your home to reduce risk of falls?: Yes Life alert?: No Use of a cane, walker or w/c?: No Grab  bars in the bathroom?: No Shower chair or bench in shower?: No Elevated toilet seat or a handicapped toilet?: Yes  TIMED UP AND GO:  Was the test performed?  No  Cognitive Function: 6CIT completed        02/10/2024   10:40 AM 02/04/2023   10:39 AM  6CIT Screen  What Year? 0 points 0 points  What month? 0 points 0 points  What time? 0 points 0 points  Count back from 20 4 points 0 points  Months in reverse 4 points 4 points  Repeat phrase 10 points 6 points  Total Score 18 points 10 points    Immunizations Immunization History  Administered Date(s) Administered   PFIZER(Purple Top)SARS-COV-2 Vaccination 03/30/2020, 04/20/2020   PNEUMOCOCCAL CONJUGATE-20 03/08/2021    Screening Tests Health Maintenance  Topic Date Due   DTaP/Tdap/Td (1 - Tdap) Never done   Zoster Vaccines- Shingrix (1 of 2) Never done   COVID-19 Vaccine (3 - 2024-25 season) 08/11/2023   INFLUENZA VACCINE  03/09/2024 (Originally 07/11/2023)   Medicare Annual Wellness (AWV)  02/09/2025   Pneumonia Vaccine 44+ Years old  Completed   DEXA SCAN  Completed   HPV VACCINES  Aged Out    Health Maintenance  Health Maintenance Due  Topic Date Due   DTaP/Tdap/Td (1 - Tdap) Never done   Zoster Vaccines- Shingrix (1 of 2) Never done   COVID-19 Vaccine (3 - 2024-25 season) 08/11/2023   Health Maintenance Items Addressed:all Flu Vaccine: Patient declined flu vaccine Due, Education has been provided regarding the importance of this vaccine. Advised may receive this vaccine at local pharmacy or Health Dept. Aware to provide a copy of the vaccination record if obtained from local pharmacy or Health Dept. Verbalized acceptance and understanding.    Additional Screening:  Vision Screening: Recommended annual ophthalmology exams for early detection of glaucoma and other disorders of the eye.  Dental Screening: Recommended annual dental exams for proper oral hygiene  Community Resource Referral / Chronic Care  Management: CRR required this visit?  No   CCM required this visit?  Appt made with first available provider    Plan:     I have personally reviewed and noted the following in the patient's chart:   Medical and social history Use of alcohol, tobacco or illicit drugs  Current medications and supplements including opioid prescriptions. Patient is not currently taking opioid prescriptions. Functional ability and status Nutritional status Physical activity Advanced directives List of other physicians Hospitalizations, surgeries, and ER visits in previous 12 months Vitals Screenings to include cognitive, depression, and falls Referrals and appointments  In addition, I have reviewed and discussed with patient certain preventive protocols, quality metrics, and best practice recommendations. A written personalized care plan for preventive services as well as general preventive health recommendations were provided to patient.    Sue Lush, LPN   3/0/8657   After Visit Summary: (Declined) Due to this being a telephonic visit, with patients personalized plan was offered to patient but patient Declined AVS at this time   Notes:  I spoke with pt and her daughter for the AWV over the phone. Pt daughter very concerned and adamant to have pt seen before 02/17/24 w/PCP.   She relays pt is experiencing lots of anxiety which she thinks is causing her to forget and not function well at times. Pt did poorly on 6CIT and very unsure/hesitant in her answers. Pt seemed confused at times. Pt scheduled w/[facility]-only availability on tomorrow am.

## 2024-02-10 NOTE — Patient Instructions (Addendum)
 Ms. Susan Goodman , Thank you for taking time to come for your Medicare Wellness Visit. I appreciate your ongoing commitment to your health goals. Please review the following plan we discussed and let me know if I can assist you in the future.   Referrals/Orders/Follow-Ups/Clinician Recommendations:   You have been referred to establish care with a new eye care provider. If you have not heard from them in the next week, please call to schedule your appointment  [facility]. [facility] Garret [address] [place], [place] [address]  25 mi [phone]  Patient complains of difficulty with hearing. ENT referral placed. Patient is in agreement with treatment plan. Aware that the office will call with an appointment.    This is a list of the screening recommended for you and due dates:  Health Maintenance  Topic Date Due   DTaP/Tdap/Td vaccine (1 - Tdap) Never done   Zoster (Shingles) Vaccine (1 of 2) Never done   COVID-19 Vaccine (3 - 2024-25 season) 08/11/2023   Flu Shot  03/09/2024*   Medicare Annual Wellness Visit  02/09/2025   Pneumonia Vaccine  Completed   DEXA scan (bone density measurement)  Completed   HPV Vaccine  Aged Out  *Topic was postponed. The date shown is not the original due date.    Advanced directives: (Copy Requested) Please bring a copy of your health care power of attorney and living will to the office to be added to your chart at your convenience.  Next Medicare Annual Wellness Visit scheduled for next year: Yes 02/11/2024 @ 10:10am televisit

## 2024-02-11 ENCOUNTER — Encounter: Payer: Self-pay | Admitting: General Practice

## 2024-02-11 ENCOUNTER — Ambulatory Visit (INDEPENDENT_AMBULATORY_CARE_PROVIDER_SITE_OTHER): Admitting: General Practice

## 2024-02-11 VITALS — BP 120/80 | HR 82 | Temp 97.4°F | Ht 61.5 in | Wt 178.0 lb

## 2024-02-11 DIAGNOSIS — F32A Depression, unspecified: Secondary | ICD-10-CM | POA: Diagnosis not present

## 2024-02-11 DIAGNOSIS — F419 Anxiety disorder, unspecified: Secondary | ICD-10-CM | POA: Diagnosis not present

## 2024-02-11 MED ORDER — SERTRALINE HCL 50 MG PO TABS: 50.0000 mg | ORAL_TABLET | Freq: Every day | ORAL | 0 refills | Status: DC

## 2024-02-11 MED ORDER — HYDROXYZINE PAMOATE 25 MG PO CAPS: 25.0000 mg | ORAL_CAPSULE | Freq: Three times a day (TID) | ORAL | 0 refills | Status: DC | PRN

## 2024-02-11 NOTE — Assessment & Plan Note (Addendum)
 Uncontrolled.  Discussed symptoms at length with niece and patient.  Agreeable to start Zoloft 25 mg once daily at bedtime for 7 days and increase to 50 mg thereafter.  Rx sent.  Start hydroxyzine 25 mg every 8 hours as needed for anxiety.  Rx sent.  Follow-up with PCP in 4 weeks.

## 2024-02-11 NOTE — Patient Instructions (Addendum)
 Start sertraline 25 mg once daily for 7 days and then increase to 50 mg thereafter.   Start Hydroxyzine 25 mg every 8 hours as needed.   As discussed these medications can make you drowsy.   Follow up with your primary care provider in 4 weeks.

## 2024-02-11 NOTE — Progress Notes (Signed)
 Established Patient Office Visit  Subjective   Patient ID: Susan Goodman, female    DOB: 1941/06/07  Age: 83 y.o. MRN: [id number]  Chief Complaint  Patient presents with   Anxiety    Not currently taking anything but did in the past daughter thinks. Wasn't working well.     Anxiety Patient reports no chest pain, dizziness, nausea, nervous/anxious behavior, shortness of breath or suicidal ideas.    Susan Goodman is a 83 year old female, patient of Mordecai Maes, NP, presents today with her niece, Susan Goodman, to discuss anxiety.   She reports that her mind races all the time and worries a lot. She feels that she gets irritated easily and she has not been able to sleep. She would like to be able to enjoy things like going to church.  She does things but she is not enjoying them anymore. Denies SI/HI.  Her niece reports that the patient had her head back in 2020 and had a head CT done which showed mild chronic ischemic white matter changes.  Patient also had another CT of her head in 2021 which showed 5 mm nonspecific hyperdense focus likely early calcification over the left frontal periventricular white matter.  Her niece suspects that could be causing some of her current symptoms.   Patient Active Problem List   Diagnosis Date Noted   Abnormal sensation of leg 01/13/2024   Globus sensation 06/03/2023   Other headache syndrome 06/03/2023   Paresthesia 06/03/2023   Urinary tract infection without hematuria 10/15/2022   Lightheaded 10/15/2022   Burning with urination 10/15/2022   Preventative health care 02/19/2022   Family history of diabetes mellitus 02/19/2022   Abdominal discomfort 02/19/2022   Pure hypercholesterolemia 02/19/2022   Leukopenia 02/19/2022   Contracture of joint of finger, left 09/28/2019   Melena 05/29/2019   Anxiety and depression    Past Medical History:  Diagnosis Date   Anxiety    Arthritis    Cataract    GERD (gastroesophageal  reflux disease)    History of colon polyps    Past Surgical History:  Procedure Laterality Date   CATARACT EXTRACTION Bilateral    COLONOSCOPY  2010   In [place]    OVARIAN CYST REMOVAL     1980's    No Known Allergies       02/11/2024    9:19 AM 02/10/2024   10:36 AM 06/03/2023   11:34 AM  Depression screen PHQ 2/9  Decreased Interest 3 0 0  Down, Depressed, Hopeless 3 0 0  PHQ - 2 Score 6 0 0  Altered sleeping 3  0  Tired, decreased energy 3  0  Change in appetite 0  0  Feeling bad or failure about yourself  3  0  Trouble concentrating 0  0  Moving slowly or fidgety/restless 3  0  Suicidal thoughts 0  0  PHQ-9 Score 18  0  Difficult doing work/chores Somewhat difficult  Not difficult at all       02/11/2024    9:19 AM 02/19/2022    1:02 PM  GAD 7 : Generalized Anxiety Score  Nervous, Anxious, on Edge 3 0  Control/stop worrying 3 2  Worry too much - different things 3 2  Trouble relaxing 2 1  Restless 0 1  Easily annoyed or irritable 3 1  Afraid - awful might happen 3 2  Total GAD 7 Score 17 9  Anxiety Difficulty Somewhat difficult Somewhat difficult  Review of Systems  Constitutional:  Negative for chills and fever.  Respiratory:  Negative for shortness of breath.   Cardiovascular:  Negative for chest pain.  Gastrointestinal:  Negative for abdominal pain, constipation, diarrhea, heartburn, nausea and vomiting.  Genitourinary:  Negative for dysuria, frequency and urgency.  Neurological:  Negative for dizziness and headaches.  Endo/Heme/Allergies:  Negative for polydipsia.  Psychiatric/Behavioral:  Positive for depression. Negative for suicidal ideas. The patient is not nervous/anxious.       Objective:     BP 120/80 (BP Location: Left Arm, Patient Position: Sitting, Cuff Size: Normal)   Pulse 82   Temp (!) 97.4 F (36.3 C) (Oral)   Ht 5' 1.5" (1.562 m)   Wt 178 lb (80.7 kg)   SpO2 98%   BMI 33.09 kg/m  BP Readings from Last 3 Encounters:   02/11/24 120/80  01/13/24 104/68  06/03/23 138/72   Wt Readings from Last 3 Encounters:  02/11/24 178 lb (80.7 kg)  02/10/24 167 lb (75.8 kg)  01/13/24 177 lb (80.3 kg)      Physical Exam Vitals and nursing note reviewed.  Constitutional:      Appearance: Normal appearance.  Cardiovascular:     Rate and Rhythm: Normal rate and regular rhythm.     Pulses: Normal pulses.     Heart sounds: Normal heart sounds.  Pulmonary:     Effort: Pulmonary effort is normal.     Breath sounds: Normal breath sounds.  Neurological:     General: No focal deficit present.     Mental Status: She is alert and oriented to person, place, and time. Mental status is at baseline.  Psychiatric:        Mood and Affect: Mood normal.        Behavior: Behavior normal.        Thought Content: Thought content normal.        Judgment: Judgment normal.      No results found for any visits on 02/11/24.     The ASCVD Risk score (Arnett DK, et al., 2019) failed to calculate for the following reasons:   The 2019 ASCVD risk score is only valid for ages 66 to 3    Assessment & Plan:  Anxiety and depression Assessment & Plan: Uncontrolled.  Discussed symptoms at length with niece and patient.  Agreeable to start Zoloft 25 mg once daily at bedtime for 7 days and increase to 50 mg thereafter.  Rx sent.  Start hydroxyzine 25 mg every 8 hours as needed for anxiety.  Rx sent.  Follow-up with PCP in 4 weeks.  Orders: -     Sertraline HCl; Take 1 tablet (50 mg total) by mouth daily.  Dispense: 30 tablet; Refill: 0 -     hydrOXYzine Pamoate; Take 1 capsule (25 mg total) by mouth every 8 (eight) hours as needed.  Dispense: 30 capsule; Refill: 0     Return in about 4 weeks (around 03/10/2024) for sertaline .    [place] Charon, NP

## 2024-02-17 ENCOUNTER — Telehealth: Payer: Self-pay

## 2024-02-17 ENCOUNTER — Ambulatory Visit: Payer: Medicare HMO | Admitting: Nurse Practitioner

## 2024-02-17 NOTE — Telephone Encounter (Signed)
 Returned Susan Goodman's call. Advised to Susan Goodman the reason for the lower dose. She verbalized understanding. No questions or concerns.

## 2024-02-17 NOTE — Telephone Encounter (Signed)
 Copied from CRM [id number]. Topic: Clinical - Medication Question >> Feb 17, 2024 12:31 PM [place] M wrote: Reason for CRM: Patient was put on sertraline (ZOLOFT) 50 MG tablet, she was taking 100 MG before and wanting to know why it was cut in half. Please reach out to Marisue Humble [phone]  Looks like patient was seen by Norma Fredrickson about this in office last. Per note on 02/11/24:

## 2024-02-17 NOTE — Telephone Encounter (Signed)
 I assume there was a break in therapy. She has to be re titrated back up on the medication. She cannot go from not being on it back to 100mg . If she does that she will likely get sick from the side effects

## 2024-03-04 ENCOUNTER — Other Ambulatory Visit: Payer: Self-pay | Admitting: General Practice

## 2024-03-04 DIAGNOSIS — F419 Anxiety disorder, unspecified: Secondary | ICD-10-CM

## 2024-03-16 ENCOUNTER — Ambulatory Visit: Attending: Nurse Practitioner | Admitting: Audiologist

## 2024-03-16 DIAGNOSIS — H903 Sensorineural hearing loss, bilateral: Secondary | ICD-10-CM | POA: Insufficient documentation

## 2024-03-23 ENCOUNTER — Other Ambulatory Visit: Payer: Self-pay | Admitting: Nurse Practitioner

## 2024-03-23 ENCOUNTER — Ambulatory Visit (INDEPENDENT_AMBULATORY_CARE_PROVIDER_SITE_OTHER): Admitting: Nurse Practitioner

## 2024-03-23 VITALS — BP 116/88 | HR 63 | Temp 97.7°F | Ht 61.5 in | Wt 179.2 lb

## 2024-03-23 DIAGNOSIS — F32A Depression, unspecified: Secondary | ICD-10-CM

## 2024-03-23 DIAGNOSIS — F419 Anxiety disorder, unspecified: Secondary | ICD-10-CM

## 2024-03-23 NOTE — Progress Notes (Signed)
   Established Patient Office Visit  Subjective   Patient ID: Susan Goodman, female    DOB: [birth year]  Age: 83 y.o. MRN: [id number]  Chief Complaint  Patient presents with   Medication Management    Pt complains of doing fairly okay on Sertraline. Pt's niece states that she thinks pt needs to increase in dosage.  Complains that its not strong enough and wants to know if she can go back to 100mg     HPI  Anxiety/depression: patient was seen in office on 03/0/2025 by [place] Charon, NP. She came to me on zoloft 100mg  where she had tried to wean off but had withdrawal sympotms. We continued the medication. She had a follow up with me where she had been off the medication for 2 months and she was started on buspar and was lost to follow up   Statse that she feels like she is doing well. States that she does wathc late programs and "does ok with sleep" niece states that she will nod off during the program. She does fall sleep while watching the program.    Review of Systems  Constitutional:  Negative for chills and fever.  Respiratory:  Negative for shortness of breath.   Cardiovascular:  Negative for chest pain.  Neurological:  Negative for headaches.  Psychiatric/Behavioral:  Negative for hallucinations and suicidal ideas.       Objective:     BP 116/88   Pulse 63   Temp 97.7 F (36.5 C) (Oral)   Ht 5' 1.5" (1.562 m)   Wt 179 lb 3.2 oz (81.3 kg)   SpO2 96%   BMI 33.31 kg/m  BP Readings from Last 3 Encounters:  03/23/24 116/88  02/11/24 120/80  01/13/24 104/68   Wt Readings from Last 3 Encounters:  03/23/24 179 lb 3.2 oz (81.3 kg)  02/11/24 178 lb (80.7 kg)  02/10/24 167 lb (75.8 kg)   SpO2 Readings from Last 3 Encounters:  03/23/24 96%  02/11/24 98%  01/13/24 97%      Physical Exam Vitals and nursing note reviewed.  Constitutional:      Appearance: Normal appearance.  Cardiovascular:     Rate and Rhythm: Normal rate and regular rhythm.     Heart  sounds: Normal heart sounds.  Pulmonary:     Effort: Pulmonary effort is normal.     Breath sounds: Normal breath sounds.  Neurological:     Mental Status: She is alert.      No results found for any visits on 03/23/24.    The ASCVD Risk score (Arnett DK, et al., 2019) failed to calculate for the following reasons:   The 2019 ASCVD risk score is only valid for ages 26 to 80    Assessment & Plan:   Problem List Items Addressed This Visit       Other   Anxiety and depression - Primary   Patient denies HI/SI/AVH.  Currently on sertraline 50 mg daily.  With some relief.  Patient had better relief with hydroxyzine.  They were unsure if they could take sertraline or hydroxyzine together.  Clarified that they can.  Did review sedation precautions and dizziness precautions with use of hydroxyzine.  Will continue sertraline 50 mg for now follow-up in 4 weeks if patient still feels like she not having full relief consider titrating sertraline up to 100 mg.       Return in about 4 weeks (around 04/20/2024) for MDD/GAD.    Audria Nine, NP

## 2024-03-23 NOTE — Assessment & Plan Note (Signed)
 Patient denies HI/SI/AVH.  Currently on sertraline 50 mg daily.  With some relief.  Patient had better relief with hydroxyzine.  They were unsure if they could take sertraline or hydroxyzine together.  Clarified that they can.  Did review sedation precautions and dizziness precautions with use of hydroxyzine.  Will continue sertraline 50 mg for now follow-up in 4 weeks if patient still feels like she not having full relief consider titrating sertraline up to 100 mg.

## 2024-03-23 NOTE — Patient Instructions (Signed)
 Nice to see you today I want to see you in 4 weeks, sooner if you need me  We will continue the Zoloft 50mg  for now You can use the hydroxyzine 3 times a day if you need it but it can make you sleepy

## 2024-04-07 ENCOUNTER — Ambulatory Visit: Admitting: Audiologist

## 2024-04-07 DIAGNOSIS — H903 Sensorineural hearing loss, bilateral: Secondary | ICD-10-CM | POA: Diagnosis not present

## 2024-04-07 NOTE — Procedures (Signed)
 Outpatient Audiology and [facility] [address] [place], [place]  [address] [phone]  AUDIOLOGICAL  EVALUATION  NAME: Susan Goodman     DOB:   25-Nov-1941      MRN: [id number]                                                                                     DATE: 04/07/2024     REFERENT: Dorothe Gaster, NP STATUS: Outpatient DIAGNOSIS: Sensorineural hearing loss, bilaterally   History: Modestine was seen for an audiological evaluation and was accompanied by a family member who supplemented case history. Breale was seen due to a gradual decrease in hearing. Sherilynn stated that she struggles to hear in most situations and often must ask people to repeat themselves. Yasameen denied any history of hazardous noise exposure, aural pain or fullness. Deirdre endorsed constant tinnitus in both ears which sometimes will keep her up at night. Yenty does have a history of head injury, hitting the back of her head on the sink in 2019. Breyon stated that she did not lose consciousness. Jayauna denied any history of stroke.  Allisyn's family member also noted that Freeda has dementia.   Evaluation:  Otoscopy showed a clear view of the tympanic membrane, bilaterally Tympanometry results were consistent with normal movement (Type A) of tympanic membrane, bilaterally Audiometric testing was completed using Conventional Audiometry techniques with insert earphones and supra-aural headphones. Test results are consistent with normal hearing sensitivity from 250 -4000 Hz sloping to moderately severe sensorineural hearing loss in the right ear. In the left ear, test results are consistent with normal hearing sensitivity from 250 -1000 Hz sloping to a moderately severe sensorineural hearing loss. A left asymmetry is noted at 2000, 3000, 4000, 6000 Hz. Speech Recognition Thresholds were obtained at 15 dB HL in the right ear and at 35  dB HL in the left ear. Word  Recognition Testing was completed at 55 dB HL in the right ear and 75 dB HL in the left ear. Heiley scored a 92% and 96% respectively.   Results:  The test results were reviewed with Nellie Banas and her family. Jennfer has a  moderately severe high frequency sensorineural hearing loss in the right ear and a moderately severe sensorineural hearing loss in the left ear. A left asymmetry was noted at 2000, 3000, 4000, 6000 Hz. Volanda could benefit from consistent hearing aid use in her left ear. Due to the asymmetric nature of Madden's hearing loss, it is recommended she see ENT for medical clearance for hearing aids. An audiogram and list of local ENT and hearing aid providers was given to [place].    Recommendations: 1.   Referral for ENT due to asymmetric nature of hearing loss 2.   Consistent hearing aid usage in left ear pending ENT clearance   35 minutes spent testing and counseling on results.   If you have any questions please feel free to contact me at [phone].  Raynald Calkins Stalnaker Audiologist, Au.D., CCC-A  Amaryllis Junior B.S.  Audiology Graduate Student   During this evaluation, the Audiologist was present, participating in and directing the student.  I agree with the  following procedure note after reviewing documentation. This session was performed under the supervision of a licensed clinician.  During this session, the Audiologist  was present, participating in and directing the treatment.  04/07/2024  9:28 AM  Cc: Dorothe Gaster, NP

## 2024-04-27 ENCOUNTER — Ambulatory Visit (INDEPENDENT_AMBULATORY_CARE_PROVIDER_SITE_OTHER): Admitting: Nurse Practitioner

## 2024-04-27 VITALS — BP 102/70 | HR 66 | Temp 98.2°F | Ht 61.5 in | Wt 177.0 lb

## 2024-04-27 DIAGNOSIS — F419 Anxiety disorder, unspecified: Secondary | ICD-10-CM | POA: Diagnosis not present

## 2024-04-27 DIAGNOSIS — R109 Unspecified abdominal pain: Secondary | ICD-10-CM | POA: Diagnosis not present

## 2024-04-27 DIAGNOSIS — F32A Depression, unspecified: Secondary | ICD-10-CM

## 2024-04-27 DIAGNOSIS — R10A0 Flank pain, unspecified side: Secondary | ICD-10-CM

## 2024-04-27 MED ORDER — SERTRALINE HCL 50 MG PO TABS: 50.0000 mg | ORAL_TABLET | Freq: Every day | ORAL | 2 refills | Status: DC

## 2024-04-27 MED ORDER — HYDROXYZINE PAMOATE 25 MG PO CAPS: 25.0000 mg | ORAL_CAPSULE | Freq: Three times a day (TID) | ORAL | 1 refills | Status: DC | PRN

## 2024-04-27 NOTE — Assessment & Plan Note (Signed)
 Patient's bowels are fairly regular.  She does sleep on the couch which does not have the greatest support for her and family over.  Likely contributing to patient's discomfort she denies any urinary or vaginal symptoms.  Courage patient asleep in the bed plan for To see this helps resolve her issues

## 2024-04-27 NOTE — Assessment & Plan Note (Signed)
 Patient currently maintained on sertraline  50 mg daily along with hydroxyzine  25 mg 3 times daily as needed.  Patient gets sleepy with hydroxyzine  to use as infrequently.  Offered to decrease the dose to 10 mg but politely declined.  Patient is having vivid dreams on sertraline  but they did not scary or harmful patient able to sleep through the night.  Will continue medication as if at the start recommendation consider switching patient to fluoxetine 20 mg daily.  Patient denies HI/SI/AVH.

## 2024-04-27 NOTE — Patient Instructions (Signed)
 Nice to see you today I have refilled your medications I want to see you in approx 4 months for your physical and labs, sooner if you need me

## 2024-04-27 NOTE — Progress Notes (Signed)
 Established Patient Office Visit  Subjective   Patient ID: Susan Goodman, female    DOB: Jul 10, 1941  Age: 83 y.o. MRN: [id number]  Chief Complaint  Patient presents with   Follow-up    Anxiety and depression follow up. Pt complains of doing okay. Pt states of having vivid dreams.    Medication Refill    Sertaline and hydroxyzine        Anxiety and depression: Patient was seen by me on 03/23/2024.  At that juncture patient was maintained on Toprol 50 mg and hydroxyzine .  They were scared to use both medications together.  We did clarify that that was okay to use did review precautions at that juncture.  She is here for follow-up to see how her moods been doing States that she feels like she is doing better. States that she is doing well. She is having vivid dreams described as pleasant.   Right flank pain: states that it intermittnet and does not linger. Does not happen daily. She does have a BM daily to every other day. She deos not sleep in her bed. She is sleeping on her couch, up right patient pain symptoms contributing towards the cause of her discomfort   Review of Systems  Constitutional:  Negative for chills and fever.  Respiratory:  Negative for shortness of breath.   Cardiovascular:  Negative for chest pain.  Gastrointestinal:  Negative for abdominal pain, constipation, diarrhea, nausea and vomiting.  Genitourinary:  Negative for dysuria and frequency.  Neurological:  Negative for dizziness and headaches.  Psychiatric/Behavioral:  Negative for hallucinations and suicidal ideas.       Objective:     BP 102/70   Pulse 66   Temp 98.2 F (36.8 C) (Oral)   Ht 5' 1.5" (1.562 m)   Wt 177 lb (80.3 kg)   SpO2 94%   BMI 32.90 kg/m  BP Readings from Last 3 Encounters:  04/27/24 102/70  03/23/24 116/88  02/11/24 120/80   Wt Readings from Last 3 Encounters:  04/27/24 177 lb (80.3 kg)  03/23/24 179 lb 3.2 oz (81.3 kg)  02/11/24 178 lb (80.7 kg)   SpO2  Readings from Last 3 Encounters:  04/27/24 94%  03/23/24 96%  02/11/24 98%      Physical Exam Vitals and nursing note reviewed.  Constitutional:      Appearance: Normal appearance.  Cardiovascular:     Rate and Rhythm: Normal rate and regular rhythm.     Heart sounds: Normal heart sounds.  Pulmonary:     Effort: Pulmonary effort is normal.     Breath sounds: Normal breath sounds.  Abdominal:     General: Bowel sounds are normal. There is no distension.     Palpations: There is no mass.     Tenderness: There is no abdominal tenderness.     Hernia: No hernia is present.  Neurological:     Mental Status: She is alert.      No results found for any visits on 04/27/24.    The ASCVD Risk score (Arnett DK, et al., 2019) failed to calculate for the following reasons:   The 2019 ASCVD risk score is only valid for ages 38 to 108    Assessment & Plan:   Problem List Items Addressed This Visit       Other   Anxiety and depression   Patient currently maintained on sertraline  50 mg daily along with hydroxyzine  25 mg 3 times daily as needed.  Patient gets sleepy  with hydroxyzine  to use as infrequently.  Offered to decrease the dose to 10 mg but politely declined.  Patient is having vivid dreams on sertraline  but they did not scary or harmful patient able to sleep through the night.  Will continue medication as if at the start recommendation consider switching patient to fluoxetine 20 mg daily.  Patient denies HI/SI/AVH.      Relevant Medications   hydrOXYzine  (VISTARIL ) 25 MG capsule   sertraline  (ZOLOFT ) 50 MG tablet   Flank pain - Primary   Patient's bowels are fairly regular.  She does sleep on the couch which does not have the greatest support for her and family over.  Likely contributing to patient's discomfort she denies any urinary or vaginal symptoms.  Courage patient asleep in the bed plan for To see this helps resolve her issues       Return in about 4 months (around  08/28/2024) for CPE and Labs.    Margarie Shay, NP

## 2024-06-22 ENCOUNTER — Telehealth: Payer: Self-pay

## 2024-06-22 NOTE — Telephone Encounter (Signed)
 Left message for pt to call office to let us  know if she has been taking the B12 and to get her scheduled for repeat non fasting lab.   Message Received: 4 days ago Jimmy Charlie FERNS, MD  Kallie Clotilda SQUIBB, CMA Please make sure she is taking the B12 and have her come in for this repeat test       Previous Messages    ----- Message ----- From: SYSTEM Sent: 06/18/2024   5:36 AM EDT To: Charlie FERNS Jimmy, MD   Expiring Orders (1)  Vitamin B12  Future, expires on 10/24/24       Authorizing Provider: Jimmy Charlie FERNS, MD Order Date: 01/14/24  Expected: 04/27/2024 Interval:  Last Done: Remaining: 1/1

## 2024-07-15 ENCOUNTER — Ambulatory Visit: Admitting: Nurse Practitioner

## 2024-07-15 NOTE — Progress Notes (Deleted)
   Acute Office Visit  Subjective:     Patient ID: Susan Goodman, female    DOB: 1941-06-17, 83 y.o.   MRN: [id number]  No chief complaint on file.   HPI Patient is in today for *** With a history of melena, UTI, anxiety, depression, HLD, paresthesias.  ROS      Objective:    There were no vitals taken for this visit. {Vitals History (Optional):23777}  Physical Exam  No results found for any visits on 07/15/24.      Assessment & Plan:   Problem List Items Addressed This Visit   None   No orders of the defined types were placed in this encounter.   No follow-ups on file.  Adina Crandall, NP

## 2024-07-20 ENCOUNTER — Telehealth: Payer: Self-pay | Admitting: Nurse Practitioner

## 2024-07-20 NOTE — Telephone Encounter (Signed)
 Pt's niece Maurence dropped by earlier to dropped off parking placard form.  Type of forms received: Parking placard form   Routed to: [employer] received by : Randall     Individual made aware of 3-5 business day turn around (Y/N): Y   Form completed and patient made aware of charges(Y/N): Y     Form location:  Upfront in Provider's folder

## 2024-07-23 NOTE — Telephone Encounter (Signed)
Form has been completed and placed in the outgoing MA box

## 2024-07-23 NOTE — Telephone Encounter (Signed)
 Left detailed voicemail for patient to call the office back if any concerns or questions.

## 2024-07-24 ENCOUNTER — Ambulatory Visit: Payer: Self-pay

## 2024-07-24 NOTE — Telephone Encounter (Signed)
 Memory issues are not new. Has noticed changes in the last week or so. Nice thought it was the medications but when driver took her home she didn't know where she was. She states that the abdominal pain comes and goes. She is not sure about any urinary symptoms. Advised niece that she should be seen today. We don't have any appointments in our office or another local. Advised patient nice she would need evaluation. She will take to urgent care/walk in. Advised that if no reason found for changes in urgent care to call office for follow up on memory next week.

## 2024-07-24 NOTE — Telephone Encounter (Signed)
 Noted I agree with immediate evaluation

## 2024-07-24 NOTE — Telephone Encounter (Signed)
 FYI Only or Action Required?: Action required by provider: request for appointment, clinical question for provider, and update on patient condition.  Patient was last seen in primary care on 04/27/2024 by Wendee Lynwood HERO, NP.  Called Nurse Triage reporting Abdominal Pain.  Symptoms began about a month ago.  Interventions attempted: Nothing.  Symptoms are: gradually worsening.  Triage Disposition: See PCP Within 2 Weeks  Patient/caregiver understands and will follow disposition?: No, wishes to speak with PCP    Copied from CRM [id number]. Topic: Clinical - Red Word Triage >> Jul 24, 2024 11:38 AM Gennette ORN wrote: Red Word that prompted transfer to Nurse Triage: Patient's niece Marcelina [phone] is calling because patient is not remembering where she is and she still in pain around her stomach. Reason for Disposition  [1] Longstanding confusion (e.g., dementia, stroke) AND [2] NO worsening or change  Answer Assessment - Initial Assessment Questions 1. LOCATION and RADIATION: Where is the pain located? Does the pain spread (shoot) anywhere else?     Bilateral hips 2. QUALITY: What does the pain feel like?  (e.g., sharp, dull, aching, burning)     pain 3. SEVERITY: How bad is the pain? What does it keep you from doing?   (Scale 1-10; or mild, moderate, severe)     unknown 4. ONSET: When did the pain start? Does it come and go, or is it there all the time?     3 weeks 5. WORK OR EXERCISE: Has there been any recent work or exercise that involved this part of the body?      no 6. CAUSE: What do you think is causing the hip pain?      sitting 7. AGGRAVATING FACTORS: What makes the hip pain worse? (e.g., walking, climbing stairs, running)     sitting 8. OTHER SYMPTOMS: Do you have any other symptoms? (e.g., back pain, pain shooting down leg,  fever, rash)     Memory loss  Answer Assessment - Initial Assessment Questions 1. LEVEL OF CONSCIOUSNESS: How are they  (the patient) acting right now? (e.g., alert-oriented, confused, lethargic, stuporous, comatose)     Decrease in memory 2. ONSET: When did the confusion start?  (e.g., minutes, hours, days)     9-10 months 3. PATTERN: Does this come and go, or has it been constant since it started?  Is it present now?     Comes and goes 4. ALCOHOL or DRUGS: Have they been drinking alcohol or taking any drugs?      no 5. NARCOTIC MEDICINES: Have they been receiving any narcotic medications? (e.g., morphine, Vicodin)     no 6. CAUSE: What do you think is causing the confusion?      unknown 7. OTHER SYMPTOMS: Are there any other symptoms? (e.g., difficulty breathing, fever, headache, weakness)     Forgetting her words  Protocols used: Hip Pain-A-AH, Confusion - Delirium-A-AH

## 2024-07-24 NOTE — Telephone Encounter (Signed)
 NT didn't quite get to finish call as pt niece states she has to call back and then hung up the phone.

## 2024-08-03 DIAGNOSIS — R1084 Generalized abdominal pain: Secondary | ICD-10-CM | POA: Diagnosis not present

## 2024-08-03 DIAGNOSIS — N3001 Acute cystitis with hematuria: Secondary | ICD-10-CM | POA: Diagnosis not present

## 2024-08-31 ENCOUNTER — Ambulatory Visit (INDEPENDENT_AMBULATORY_CARE_PROVIDER_SITE_OTHER): Admitting: Nurse Practitioner

## 2024-08-31 ENCOUNTER — Encounter: Payer: Self-pay | Admitting: Nurse Practitioner

## 2024-08-31 VITALS — BP 112/66 | HR 76 | Temp 98.2°F | Ht 61.0 in | Wt 168.8 lb

## 2024-08-31 DIAGNOSIS — R300 Dysuria: Secondary | ICD-10-CM | POA: Diagnosis not present

## 2024-08-31 DIAGNOSIS — Z1231 Encounter for screening mammogram for malignant neoplasm of breast: Secondary | ICD-10-CM

## 2024-08-31 DIAGNOSIS — E538 Deficiency of other specified B group vitamins: Secondary | ICD-10-CM | POA: Diagnosis not present

## 2024-08-31 DIAGNOSIS — F32A Depression, unspecified: Secondary | ICD-10-CM

## 2024-08-31 DIAGNOSIS — E7800 Pure hypercholesterolemia, unspecified: Secondary | ICD-10-CM

## 2024-08-31 DIAGNOSIS — M8580 Other specified disorders of bone density and structure, unspecified site: Secondary | ICD-10-CM | POA: Diagnosis not present

## 2024-08-31 DIAGNOSIS — Z0000 Encounter for general adult medical examination without abnormal findings: Secondary | ICD-10-CM | POA: Diagnosis not present

## 2024-08-31 DIAGNOSIS — R202 Paresthesia of skin: Secondary | ICD-10-CM

## 2024-08-31 DIAGNOSIS — Z23 Encounter for immunization: Secondary | ICD-10-CM

## 2024-08-31 DIAGNOSIS — W19XXXA Unspecified fall, initial encounter: Secondary | ICD-10-CM

## 2024-08-31 DIAGNOSIS — F419 Anxiety disorder, unspecified: Secondary | ICD-10-CM | POA: Diagnosis not present

## 2024-08-31 DIAGNOSIS — Z711 Person with feared health complaint in whom no diagnosis is made: Secondary | ICD-10-CM

## 2024-08-31 DIAGNOSIS — L853 Xerosis cutis: Secondary | ICD-10-CM

## 2024-08-31 LAB — LIPID PANEL
Cholesterol: 207 mg/dL — ABNORMAL HIGH (ref 0–200)
HDL: 78.3 mg/dL (ref >39.00)
LDL Cholesterol: 120 mg/dL — ABNORMAL HIGH (ref 0–99)
NonHDL: 128.99
Total CHOL/HDL Ratio: 3
Triglycerides: 45 mg/dL (ref 0.0–149.0)
VLDL: 9 mg/dL (ref 0.0–40.0)

## 2024-08-31 LAB — COMPREHENSIVE METABOLIC PANEL WITH GFR
ALT: 13 U/L (ref 0–35)
AST: 20 U/L (ref 0–37)
Albumin: 4.3 g/dL (ref 3.5–5.2)
Alkaline Phosphatase: 60 U/L (ref 39–117)
BUN: 13 mg/dL (ref 6–23)
CO2: 32 meq/L (ref 19–32)
Calcium: 9.4 mg/dL (ref 8.4–10.5)
Chloride: 102 meq/L (ref 96–112)
Creatinine, Ser: 0.78 mg/dL (ref 0.40–1.20)
GFR: 70.44 mL/min (ref >60.00)
Glucose, Bld: 91 mg/dL (ref 70–99)
Potassium: 4.2 meq/L (ref 3.5–5.1)
Sodium: 142 meq/L (ref 135–145)
Total Bilirubin: 0.5 mg/dL (ref 0.2–1.2)
Total Protein: 6.8 g/dL (ref 6.0–8.3)

## 2024-08-31 LAB — CBC WITH DIFFERENTIAL/PLATELET
Basophils Absolute: 0 K/uL (ref 0.0–0.1)
Basophils Relative: 0.7 % (ref 0.0–3.0)
Eosinophils Absolute: 0 K/uL (ref 0.0–0.7)
Eosinophils Relative: 1.1 % (ref 0.0–5.0)
HCT: 38.1 % (ref 36.0–46.0)
Hemoglobin: 12.7 g/dL (ref 12.0–15.0)
Lymphocytes Relative: 41 % (ref 12.0–46.0)
Lymphs Abs: 1.8 K/uL (ref 0.7–4.0)
MCHC: 33.3 g/dL (ref 30.0–36.0)
MCV: 89.4 fl (ref 78.0–100.0)
Monocytes Absolute: 0.3 K/uL (ref 0.1–1.0)
Monocytes Relative: 7.8 % (ref 3.0–12.0)
Neutro Abs: 2.1 K/uL (ref 1.4–7.7)
Neutrophils Relative %: 49.4 % (ref 43.0–77.0)
Platelets: 222 K/uL (ref 150.0–400.0)
RBC: 4.26 Mil/uL (ref 3.87–5.11)
RDW: 13.5 % (ref 11.5–15.5)
WBC: 4.3 K/uL (ref 4.0–10.5)

## 2024-08-31 LAB — VITAMIN B12: Vitamin B-12: >1500 pg/mL — ABNORMAL HIGH (ref 211–911)

## 2024-08-31 LAB — TSH: TSH: 0.47 u[IU]/mL (ref 0.35–5.50)

## 2024-08-31 LAB — VITAMIN D 25 HYDROXY (VIT D DEFICIENCY, FRACTURES): VITD: 34.29 ng/mL (ref 30.00–100.00)

## 2024-08-31 NOTE — Assessment & Plan Note (Signed)
 History of the same with UTI.  Having some dysuria per family's report pending UA with reflexive culture

## 2024-08-31 NOTE — Assessment & Plan Note (Signed)
 History of the same pending lipid panel today

## 2024-08-31 NOTE — Assessment & Plan Note (Signed)
 History of the same and a history of low B12 level.  Pending B12 level today

## 2024-08-31 NOTE — Assessment & Plan Note (Signed)
 Mechanical fall striking bilateral knees.  Nontender on exam patient can use Tylenol and ice as needed follow-up if no improvement and can consider imaging at that juncture if needed

## 2024-08-31 NOTE — Assessment & Plan Note (Signed)
 Discussed age-appropriate immunizations and screening exams.  Did review patient's personal, surgical, social, family histories.  Patient is up-to-date on all age-appropriate vaccinations she would like.  Update flu vaccine today.  Patient to get tetanus vaccine and shingles vaccine at local pharmacy.  Patient is aged out of CRC screening and cervical cancer screening.  Mammogram placed for breast cancer screening today.  DEXA scan up-to-date.  Patient was given information at discharge about preventative healthcare maintenance with anticipatory guidance

## 2024-08-31 NOTE — Progress Notes (Signed)
 Established Patient Office Visit  Subjective   Patient ID: Susan Goodman, female    DOB: 1941/05/15  Age: 83 y.o. MRN: [id number]  Chief Complaint  Patient presents with   Annual Exam    Would like flu shot.     HPI  Anxiety depression: Patient currently maintained on sertraline  50 mg daily and hydroxyzine  25 mg 3 times daily as needed. Statse that she has been off for about   Allergies: Currently maintained on loratadine 10 mg daily and Flonase  nasal spray as needed  for complete physical and follow up of chronic conditions.  Immunizations: -Tetanus: Completed in -Influenza: update today  -Shingles:? -Pneumonia: Completed Prevnar 20 in 2022 -COVID: Original series  Diet: Fair diet. 2 meals a day and she will snack but has cut back trying to lose weight. She drinks water  Exercise: No regular exercise.  Eye exam: PRN reading  Dental exam: Needs updating.    Colonoscopy: Completed in 07/23/2019, repeat colonoscopy not recommended due to age Lung Cancer Screening: N/A  Pap smear: Aged out  Mammogram: 08/05/2023, repeat 1 year.  Order placed  DEXA: 08/30/2023, that showed osteopenia with a score of -2.3  Sleep: states that she goes to a day program and will take a nap around 1 and get you around 5. She will sleep at night but in the chair with the TV. She will think it is the next day   Advanced directive: has set up   Fall: in a store and missed a step striking her knees       Review of Systems  Constitutional:  Negative for chills and fever.  Respiratory:  Negative for shortness of breath.   Cardiovascular:  Negative for chest pain and leg swelling.  Gastrointestinal:  Negative for abdominal pain, blood in stool, constipation, diarrhea, nausea and vomiting.       BM daily to every other   Genitourinary:  Positive for dysuria. Negative for frequency and hematuria.  Neurological:  Negative for tingling and headaches.  Psychiatric/Behavioral:  Negative  for hallucinations and suicidal ideas.       Objective:     BP 112/66   Pulse 76   Temp 98.2 F (36.8 C) (Oral)   Ht 5' 1 (1.549 m)   Wt 168 lb 12.8 oz (76.6 kg)   SpO2 96%   BMI 31.89 kg/m  BP Readings from Last 3 Encounters:  08/31/24 112/66  04/27/24 102/70  03/23/24 116/88   Wt Readings from Last 3 Encounters:  08/31/24 168 lb 12.8 oz (76.6 kg)  04/27/24 177 lb (80.3 kg)  03/23/24 179 lb 3.2 oz (81.3 kg)   SpO2 Readings from Last 3 Encounters:  08/31/24 96%  04/27/24 94%  03/23/24 96%      Physical Exam Vitals and nursing note reviewed.  Constitutional:      Appearance: Normal appearance.  HENT:     Right Ear: Tympanic membrane, ear canal and external ear normal.     Left Ear: Tympanic membrane, ear canal and external ear normal.     Mouth/Throat:     Mouth: Mucous membranes are moist.     Pharynx: Oropharynx is clear.  Eyes:     Extraocular Movements: Extraocular movements intact.     Pupils: Pupils are equal, round, and reactive to light.  Cardiovascular:     Rate and Rhythm: Normal rate and regular rhythm.     Pulses: Normal pulses.     Heart sounds: Normal heart sounds.  Pulmonary:  Effort: Pulmonary effort is normal.     Breath sounds: Normal breath sounds.  Abdominal:     General: Bowel sounds are normal. There is no distension.     Palpations: There is no mass.     Tenderness: There is no abdominal tenderness.     Hernia: No hernia is present.  Musculoskeletal:        General: No tenderness.     Right lower leg: Edema (trace) present.     Left lower leg: Edema (trace) present.  Lymphadenopathy:     Cervical: No cervical adenopathy.  Skin:    General: Skin is warm and dry.  Neurological:     General: No focal deficit present.     Mental Status: She is alert.     Deep Tendon Reflexes:     Reflex Scores:      Bicep reflexes are 2+ on the right side and 2+ on the left side.      Patellar reflexes are 2+ on the right side and 2+ on the  left side.    Comments: Bilateral upper and lower extremity strength 5/5  Psychiatric:        Mood and Affect: Mood normal.        Behavior: Behavior normal.        Thought Content: Thought content normal.        Judgment: Judgment normal.      No results found for any visits on 08/31/24.    The ASCVD Risk score (Arnett DK, et al., 2019) failed to calculate for the following reasons:   The 2019 ASCVD risk score is only valid for ages 51 to 64    Assessment & Plan:   Problem List Items Addressed This Visit       Musculoskeletal and Integument   Osteopenia   History of the same.  Patient is doing calcium and vitamin supplementation.  Will check electrolytes along with vitamin D  level today.  Patient will be due for DEXA scan in 2026      Relevant Orders   VITAMIN D  25 Hydroxy (Vit-D Deficiency, Fractures)   Dry skin   Drug screen in the little left lower lateral extremity.  Encouraged good hydration up to using Vaseline.  For skin integrity        Other   Anxiety and depression   History of the same did not fill great change with sertraline  so self discontinued.  Has hydroxyzine  but does not use it.  Mood stable patient is not having any SI or HI.  Does endorse some benign auditory hallucinations but unsure if they are true hallucinations as she lives [place] townhome setting with thin walls and may be hearing the neighbors      Relevant Orders   CBC with Differential/Platelet   Comprehensive metabolic panel with GFR   TSH   Preventative health care - Primary   Discussed age-appropriate immunizations and screening exams.  Did review patient's personal, surgical, social, family histories.  Patient is up-to-date on all age-appropriate vaccinations she would like.  Update flu vaccine today.  Patient to get tetanus vaccine and shingles vaccine at local pharmacy.  Patient is aged out of CRC screening and cervical cancer screening.  Mammogram placed for breast cancer screening  today.  DEXA scan up-to-date.  Patient was given information at discharge about preventative healthcare maintenance with anticipatory guidance      Relevant Orders   CBC with Differential/Platelet   Comprehensive metabolic panel with GFR  TSH   Pure hypercholesterolemia   History of the same pending lipid panel today      Relevant Orders   Lipid panel   Dysuria   History of the same with UTI.  Having some dysuria per family's report pending UA with reflexive culture      Relevant Orders   Urinalysis w microscopic + reflex cultur   Paresthesia   History of the same and a history of low B12 level.  Pending B12 level today      Concern about memory   Patient having more trouble with memory and word finding difficulty.  Family history of dementia.  B12 levels were low pending that along with electrolytes and TSH.  Ambulatory referral to neurology for further evaluation and possible treatment.      Relevant Orders   Ambulatory referral to Neurology   CBC with Differential/Platelet   Comprehensive metabolic panel with GFR   TSH   Fall   Mechanical fall striking bilateral knees.  Nontender on exam patient can use Tylenol and ice as needed follow-up if no improvement and can consider imaging at that juncture if needed      Vitamin B12 deficiency   History of the same has been replating, pending B12 level      Relevant Orders   Vitamin B12   Other Visit Diagnoses       Screening mammogram for breast cancer       Relevant Orders   MM 3D SCREENING MAMMOGRAM BILATERAL BREAST     Need for influenza vaccination       Relevant Orders   Flu vaccine HIGH DOSE PF(Fluzone Trivalent) (Completed)       Return in about 6 months (around 02/28/2025) for chronic condiditons.    Adina Crandall, NP

## 2024-08-31 NOTE — Assessment & Plan Note (Signed)
 Drug screen in the little left lower lateral extremity.  Encouraged good hydration up to using Vaseline.  For skin integrity

## 2024-08-31 NOTE — Patient Instructions (Signed)
 Nice to see you today  You can use ice and tylenol as needed for your knees.  Keep the skin hydrated Follow up with me in 6 months, sooner if you need me

## 2024-08-31 NOTE — Assessment & Plan Note (Signed)
 History of the same did not fill great change with sertraline  so self discontinued.  Has hydroxyzine  but does not use it.  Mood stable patient is not having any SI or HI.  Does endorse some benign auditory hallucinations but unsure if they are true hallucinations as she lives [place] townhome setting with [place] walls and may be hearing the neighbors

## 2024-08-31 NOTE — Assessment & Plan Note (Signed)
 Patient having more trouble with memory and word finding difficulty.  Family history of dementia.  B12 levels were low pending that along with electrolytes and TSH.  Ambulatory referral to neurology for further evaluation and possible treatment.

## 2024-08-31 NOTE — Assessment & Plan Note (Signed)
 History of the same.  Patient is doing calcium and vitamin supplementation.  Will check electrolytes along with vitamin D  level today.  Patient will be due for DEXA scan in 2026

## 2024-08-31 NOTE — Assessment & Plan Note (Signed)
 History of the same has been replating, pending B12 level

## 2024-09-02 ENCOUNTER — Ambulatory Visit: Payer: Self-pay | Admitting: Nurse Practitioner

## 2024-09-02 LAB — URINE CULTURE
MICRO NUMBER:: 17002130
Result:: NO GROWTH
SPECIMEN QUALITY:: ADEQUATE

## 2024-09-02 LAB — URINALYSIS W MICROSCOPIC + REFLEX CULTURE
Bilirubin Urine: NEGATIVE
Glucose, UA: NEGATIVE
Hgb urine dipstick: NEGATIVE
Ketones, ur: NEGATIVE
Nitrites, Initial: NEGATIVE
Specific Gravity, Urine: 1.025 (ref 1.001–1.035)
pH: 6.5 (ref 5.0–8.0)

## 2024-09-02 LAB — CULTURE INDICATED

## 2024-09-22 ENCOUNTER — Telehealth: Payer: Self-pay

## 2024-09-22 ENCOUNTER — Other Ambulatory Visit: Payer: Self-pay | Admitting: Nurse Practitioner

## 2024-09-22 DIAGNOSIS — F419 Anxiety disorder, unspecified: Secondary | ICD-10-CM

## 2024-09-22 NOTE — Telephone Encounter (Unsigned)
 Copied from CRM #[id number]. Topic: Clinical - Medication Refill >> Sep 22, 2024  3:48 PM Eva FALCON wrote: Medication: hydrOXYzine  (VISTARIL ) 25 MG capsule   Has the patient contacted their pharmacy? No, niece states bottle says no refill. So she wanted to get the ball rolling. (Agent: If no, request that the patient contact the pharmacy for the refill. If patient does not wish to contact the pharmacy document the reason why and proceed with request.) (Agent: If yes, when and what did the pharmacy advise?)  This is the patient's preferred pharmacy:  [id number] - [place], [place] - [address] [place] [address] Phone: [phone] Fax: [phone]   Is this the correct pharmacy for this prescription? Yes If no, delete pharmacy and type the correct one.   Has the prescription been filled recently? Yes  Is the patient out of the medication? No  Has the patient been seen for an appointment in the last year OR does the patient have an upcoming appointment? Yes  Can we respond through MyChart? No  Agent: Please be advised that Rx refills may take up to 3 business days. We ask that you follow-up with your pharmacy.

## 2024-09-22 NOTE — Telephone Encounter (Signed)
 Copied from CRM [id number]. Topic: Referral - Question >> Sep 21, 2024 11:21 AM Laymon HERO wrote: Reason for CRM: patient was seen on 9/22 and was told a referral to neurology was going to be sent, they have not heard anything on that yet

## 2024-09-23 MED ORDER — HYDROXYZINE PAMOATE 25 MG PO CAPS: 25.0000 mg | ORAL_CAPSULE | Freq: Three times a day (TID) | ORAL | 1 refills | Status: AC | PRN

## 2024-09-28 NOTE — Telephone Encounter (Unsigned)
 Copied from CRM #[id number]. Topic: Referral - Status >> Sep 28, 2024  1:58 PM Viola F wrote: Reason for CRM: Patient niece Deland requesting status of referral to [facility], says this is her 3rd time calling and [facility] still hasn't recevied referral. She requested that office please contact Ethelene at [facility] at [phone] to get the correct fax number to resend referral. Please call Deland when referral is faxed [phone] [facility])

## 2024-09-28 NOTE — Telephone Encounter (Signed)
 After reviewing this referral, the referral was not faxed initially when referral was processed 09/22/24.    Referral has been faxed to [facility] as of today 09/28/24  Once [facility] receives and reviews the referral they ill reach out to schedule the patient.   The patient can also reach out to them later this week to schedule, but until they receive and review the referral was cannot schedule an appt.

## 2024-09-29 NOTE — Telephone Encounter (Signed)
 Left detailed voicemail for patient to call the office back if any concerns.

## 2024-10-05 ENCOUNTER — Telehealth: Payer: Self-pay

## 2024-10-05 NOTE — Telephone Encounter (Signed)
 Contacted pt and spoke with Deland (on dpr)  Maureen verbalized understanding to the information provided.   [facility] [address], [place], [place] [address]

## 2024-10-05 NOTE — Telephone Encounter (Signed)
 Susan Goodman

## 2024-10-05 NOTE — Telephone Encounter (Signed)
 I spoke with Susan Goodman (DPR signed); for 2 weeks pt is more irritable and angry; no SI/HI. Pt is taking hydroxyzine  25 mg q8h and that helps but med only last 3 - 4 hours. Pt has no H/A,dizziness,CP or SOB. Pt is also more forgetful than usual.  Susan Goodman wants to know if pt can increase Hydroxyzine  to 50 mg or can a different med be prescribed that will last longer and stabilize pts mood. Pt has appt with neurology 10/09/24. Pt request cb after reviewed by CHRISTELLA Crandall NP. UC & ED precautions given and Susan Goodman voiced understanding. [facility].

## 2024-10-05 NOTE — Telephone Encounter (Signed)
 I do not think it is a wise idea to increase hydroxyzine .  Will leave hydroxyzine  at 25 mg 3 times daily as needed.  I know patient has tried sertraline  in the past without great improvement.  We can try another medication called Prozac if she is willing.  This medication is a slow onset medication and can take up to 6 to 8 weeks to have full effect

## 2024-10-06 ENCOUNTER — Ambulatory Visit: Payer: Self-pay

## 2024-10-06 MED ORDER — FLUOXETINE HCL 10 MG PO CAPS: 10.0000 mg | ORAL_CAPSULE | Freq: Every day | ORAL | 0 refills | Status: DC

## 2024-10-06 NOTE — Addendum Note (Signed)
 Addended by: WENDEE LYNWOOD HERO on: 10/06/2024 01:19 PM   Modules accepted: Orders

## 2024-10-06 NOTE — Telephone Encounter (Signed)
 FYI Only or Action Required?: Action required by provider: request for appointment. Wanting to know if pt can be seen sooner than Thursday.  Patient was last seen in primary care on 08/31/2024 by Wendee Lynwood HERO, NP.  Called Nurse Triage reporting Altered Mental Status.  Symptoms began about a month ago.  Interventions attempted: Prescription medications: Hydroxyzine .  Symptoms are: gradually worsening.  Triage Disposition: See Physician Within 24 Hours  Patient/caregiver understands and will follow disposition?: Yes  Copied from CRM #[id number]. Topic: Clinical - Red Word Triage >> Oct 06, 2024  2:05 PM Suzen RAMAN wrote: Red Word that prompted transfer to Nurse Triage: confusion and memory changes. Would like to know if she should take patient to ED Reason for Disposition  [1] Confusion getting worse AND [2] slow onset (days to weeks)  Answer Assessment - Initial Assessment Questions Spoke with pts niece Maurene (on [place]). Pt has been prescribed hydroxyzine , helped briefly but no longer working. Reports the hydroxyzine  makes pt feel sad. New rx for prozac today from PCP, hasn't picked up medication yet and reports medication takes time to take effect. Has neurology appt scheduled this Friday, wanting to know if pt can get a shorter acting medication or other recommendations until prozac takes effect. No appt availability with PCP until November, scheduled for next soonest OV on Thursday with different provider.  1. MAIN CONCERN OR SYMPTOM:  What is your main concern right now? What questions do you have? What's the main symptom you're worried about? (e.g., confusion, memory loss)     Gradually increasing confusion. Pt wandered outside the house today confused.  2. ONSET:  When did the symptom start (or worsen)? (minutes, hours, days, weeks)     1-2 months ago  3. BETTER-SAME-WORSE: Are you (the patient) getting better, staying the same, or getting worse compared to the day you  (they) were diagnosed or most recent hospital discharge?     Mildly worse  4. DIAGNOSIS: Was the dementia diagnosed by a doctor? If Yes, ask: When? (e.g., days, months, years ago)     Has not been officially diagnosed, does have family history.  5. MEDICINES: Has there been any change in medicines recently? (e.g., narcotics, antihistamines, benzodiazepines, etc.)     Has been taking hydroxyzine , not helpful. New rx for prozac from PCP, has not picked up medication yet.   6. OTHER SYMPTOMS: Are there any other symptoms? (e.g., cough, falling, fever, pain)     Periods of anxiety and agitation. HR 90 currently, was 105 last night. Denies CP, reports intermittent SOB. Denies hallucinations.  7. SUPPORT: What type of support do you (the patient) have? Note: Document living circumstances and support (e.g., family, nursing home).     Lives with niece Marcelina.  Protocols used: Dementia Symptoms and Questions-A-AH

## 2024-10-06 NOTE — Telephone Encounter (Signed)
 Script sent in

## 2024-10-07 NOTE — Telephone Encounter (Signed)
 Can we see if anyone has any openings this afternoon. She can always go to ED if needed

## 2024-10-08 ENCOUNTER — Encounter: Payer: Self-pay | Admitting: Family Medicine

## 2024-10-08 ENCOUNTER — Ambulatory Visit: Payer: Self-pay | Admitting: *Deleted

## 2024-10-08 ENCOUNTER — Ambulatory Visit: Admitting: Family Medicine

## 2024-10-08 VITALS — BP 136/82 | HR 84 | Temp 97.8°F | Ht 61.0 in | Wt 166.1 lb

## 2024-10-08 DIAGNOSIS — F32A Depression, unspecified: Secondary | ICD-10-CM

## 2024-10-08 DIAGNOSIS — R4182 Altered mental status, unspecified: Secondary | ICD-10-CM | POA: Diagnosis not present

## 2024-10-08 LAB — POC URINALSYSI DIPSTICK (AUTOMATED)
Bilirubin, UA: NEGATIVE
Blood, UA: NEGATIVE
Glucose, UA: NEGATIVE
Nitrite, UA: NEGATIVE
Protein, UA: NEGATIVE
Spec Grav, UA: 1.025 (ref 1.010–1.025)
Urobilinogen, UA: 0.2 U/dL
pH, UA: 5.5 (ref 5.0–8.0)

## 2024-10-08 LAB — POCT UA - MICROSCOPIC ONLY

## 2024-10-08 NOTE — Assessment & Plan Note (Signed)
 Subacute worsening of chronic issue. Strong family history of Alzheimer's in patient's mother and sister. Very likely new diagnosis of dementia although I do think mood issues are contributing. Given worsening in the last 3 months, we considered possible secondary cause.  Labs in September including CBC, vitamin levels, thyroid  function and complete metabolic panel were normal.   Upon review past 2 urine cultures have returned showing no growth or mixed urogenital bacteria. No clear sign of urinary tract infection causing confusion.  Urinalysis in office contaminated with skin cells per micro. No other sign of infection such as cellulitis, upper respiratory infection, GI infection etc.  No acute constipation.  She has an appointment with neurology tomorrow for mental status exam and further recommendations.

## 2024-10-08 NOTE — Assessment & Plan Note (Signed)
 Chronic, combination of anxiety and depression likely closely related to dementia. She has started fluoxetine in the last 2 days 10 mg daily.  Encouraged niece to give this to her at bedtime to help with sleep.  Discussed that this will take likely 3 to 4 weeks before we can determine if it is helpful or if dose needs to be increased.  I do not feel hydroxyzine  is the cause of her mental status changes but could be contributing to a worsening here recently since they have been giving it several times a day.  I suggest she stops this as it is an anticholinergic.  We discussed calming behavioral techniques to help with anxiety as well as distraction techniques.

## 2024-10-08 NOTE — Patient Instructions (Addendum)
 Stop hydroxyzine .  Keep appt with neurology.   Memory Compensation Strategies   Use WARM strategy.             W= write it down             A= associate it             R= repeat it             M= make a mental note   2.   You can keep a Glass Blower/designer.             Use a 3-ring notebook with sections for the following: calendar, important names and phone numbers,  medications, doctors' names/phone numbers, lists/reminders, and a section to journal what you did       each day.           3.    Use a calendar to write appointments down.   4.    Write yourself a schedule for the day.             This can be placed on the calendar or in a separate section of the Memory Notebook.  Keeping a            regular schedule can help memory.   5.    Use medication organizer with sections for each day or morning/evening pills.             You may need help loading it   6.    Keep a basket, or pegboard by the door.             Place items that you need to take out with you in the basket or on the pegboard.  You may also want to            include a message board for reminders.   7.    Use sticky notes.             Place sticky notes with reminders in a place where the task is performed.  For example:  turn off the            stove placed by the stove, lock the door placed on the door at eye level,  take your medications on          the bathroom mirror or by the place where you normally take your medications.   8.    Use alarms/timers.             Use while cooking to remind yourself to check on food or as a reminder to take your medicine, or as a           reminder to make a call, or as a reminder to perform another task, etc.   RECOMMENDATIONS FOR ALL PATIENTS WITH MEMORY PROBLEMS: 1. Continue to exercise (Recommend 30 minutes of walking everyday, or 3 hours every week) 2. Increase social interactions - continue going to [place] and enjoy social gatherings with friends and family 3. Eat  healthy, avoid fried foods and eat more fruits and vegetables 4. Maintain adequate blood pressure, blood sugar, and blood cholesterol level. Reducing the risk of stroke and cardiovascular disease also helps promoting better memory. 5. Avoid stressful situations. Live a simple life and avoid aggravations. Organize your time and prepare for the next day in anticipation. 6. Sleep well, avoid any interruptions of sleep and avoid any distractions in the bedroom that may interfere with  adequate sleep quality 7. Avoid sugar, avoid sweets as there is a strong link between excessive sugar intake, diabetes, and cognitive impairment We discussed the Mediterranean diet, which has been shown to help patients reduce the risk of progressive memory disorders and reduces cardiovascular risk. This includes eating fish, eat fruits and green leafy vegetables, nuts like almonds and hazelnuts, walnuts, and also use olive oil. Avoid fast foods and fried foods as much as possible. Avoid sweets and sugar as sugar use has been linked to worsening of memory function.   There is always a concern of gradual progression of memory problems. If this is the case, then we may need to adjust level of care according to patient needs. Support, both to the patient and caregiver, should then be put into place.      The Alzheimer[employer] Association is here all day, every day for people facing Alzheimer's disease through our free 24/7 Helpline: [phone]. The Helpline provides reliable information and support to all those who need assistance, such as individuals living with memory loss, Alzheimer's or other dementia, caregivers, health care professionals and the public.  Our highly trained and knowledgeable staff can help you with: Understanding memory loss, dementia and Alzheimer's  Medications and other treatment options  General information about aging and brain health  Skills to provide quality care and to find the best care from  professionals  Legal, financial and living-arrangement decisions Our Helpline also features: Confidential care consultation provided by master's level clinicians who can help with decision-making support, crisis assistance and education on issues families face every day  Help in a caller's preferred language using our translation service that features more than 200 languages and dialects  Referrals to local community programs, services and ongoing support         FALL PRECAUTIONS: Be cautious when walking. Scan the area for obstacles that may increase the risk of trips and falls. When getting up in the mornings, sit up at the edge of the bed for a few minutes before getting out of bed. Consider elevating the bed at the head end to avoid drop of blood pressure when getting up. Walk always in a well-lit room (use night lights in the walls). Avoid area rugs or power cords from appliances in the middle of the walkways. Use a walker or a cane if necessary and consider physical therapy for balance exercise. Get your eyesight checked regularly.   FINANCIAL OVERSIGHT: Supervision, especially oversight when making financial decisions or transactions is also recommended.   HOME SAFETY: Consider the safety of the kitchen when operating appliances like stoves, microwave oven, and blender. Consider having supervision and share cooking responsibilities until no longer able to participate in those. Accidents with firearms and other hazards in the house should be identified and addressed as well.     ABILITY TO BE LEFT ALONE: If patient is unable to contact 911 operator, consider using LifeLine, or when the need is there, arrange for someone to stay with patients. Smoking is a fire hazard, consider supervision or cessation. Risk of wandering should be assessed by caregiver and if detected at any point, supervision and safe proof recommendations should be instituted.   MEDICATION SUPERVISION: Inability to  self-administer medication needs to be constantly addressed. Implement a mechanism to ensure safe administration of the medications.     DRIVING: Regarding driving, in patients with progressive memory problems, driving will be impaired. We advise to have someone else do the driving if trouble finding directions or if  minor accidents are reported. Independent driving assessment is available to determine safety of driving.

## 2024-10-08 NOTE — Progress Notes (Signed)
 Patient ID: [name], female    DOB: 1941/05/03, 83 y.o.   MRN: [id number]  This visit was conducted in person.  BP 136/82   Pulse 84   Temp 97.8 F (36.6 C) (Oral)   Ht 5' 1 (1.549 m)   Wt 166 lb 2 oz (75.4 kg)   SpO2 96%   BMI 31.39 kg/m    CC:  Chief Complaint  Patient presents with   Confusion    C/o confusion. Started about 4 mos ago- worsening.     Subjective:   HPI: Susan Goodman is a 83 y.o. female patient of Matt cable, NP presenting on 10/08/2024 for Confusion (C/o confusion. Started about 4 mos ago- worsening. )  Her with niece [place]. Patient  reports poor sleeping and increase fatigue.  She  notes more issues keeping her train of thought, and focus.  Trouble finding words.  Gradual issue over the last 2 years No sudden decline.  No proceeding event  She previously lived alone  in [place], now living closer to family but in her own house. Was doing well in a community program but as time has gone on... she has been confused about time/day.... in last 3 months.  Has been getting lost walking in neighborhood.  She wandered outside nieces house alone.   She has been more sad lately... just started fluoxetine in last week via phone call.  She does have hydroxyzine  on her list... an anticholinergic that can cause confusion... but uses this PRN  This does help with anxiety... calms her down but only temporarily.   Patient with a history of anxiety and depression and B12 deficiency.   Labs reviewed from August 31, 2024 B12 elevated on supplementation, told to cut B12 dose okay CBC normal, vitamin D  normal, complete metabolic panel normal.  Per chart review no previous diagnosis of dementia.  She is currently on Prozac 10 mg daily for mood disorder.   Family history of  Alzheimer in sister and mother.   Appt with neuro tommorow!    Possible dysuria,  no frequency, no urgency, no fever.  No pain.  No rash, no cough.  Having  BM daily.  Not eating great,  niece encouraging water.     10/08/2024   10:11 AM 08/31/2024   10:42 AM 04/27/2024   10:27 AM  Depression screen PHQ 2/9  Decreased Interest 3 0 0  Down, Depressed, Hopeless 3 0 0  PHQ - 2 Score 6 0 0  Altered sleeping 1 0 0  Tired, decreased energy 1 0 1  Change in appetite 0  0  Feeling bad or failure about yourself  2 0 0  Trouble concentrating 2 2 1   Moving slowly or fidgety/restless 2 2 1   Suicidal thoughts 0 0 0  PHQ-9 Score 14 4 3   Difficult doing work/chores Very difficult Somewhat difficult Not difficult at all       10/08/2024   10:12 AM 08/31/2024   10:42 AM 04/27/2024   10:26 AM 03/23/2024   10:42 AM  GAD 7 : Generalized Anxiety Score  Nervous, Anxious, on Edge 3 1 1 1   Control/stop worrying 3 1 0 1  Worry too much - different things 3 1 0 0  Trouble relaxing 3 1 0 1  Restless 2 0 0 0  Easily annoyed or irritable 3 1 1  0  Afraid - awful might happen 3 1 1 1   Total GAD 7 Score 20 6 3  4  Anxiety Difficulty Extremely difficult Somewhat difficult Not difficult at all Somewhat difficult     Relevant past medical, surgical, family and social history reviewed and updated as indicated. Interim medical history since our last visit reviewed. Allergies and medications reviewed and updated. Outpatient Medications Prior to Visit  Medication Sig Dispense Refill   Cholecalciferol (D3-1000 PO) Take by mouth.     FLUoxetine (PROZAC) 10 MG capsule Take 1 capsule (10 mg total) by mouth daily. 90 capsule 0   fluticasone  (FLONASE ) 50 MCG/ACT nasal spray Place 2 sprays into both nostrils daily. (Patient taking differently: Place 2 sprays into both nostrils daily. As needed) 16 g 0   hydrOXYzine  (VISTARIL ) 25 MG capsule Take 1 capsule (25 mg total) by mouth every 8 (eight) hours as needed. 30 capsule 1   loratadine (CLARITIN) 10 MG tablet Take 10 mg by mouth daily. (Patient taking differently: Take 10 mg by mouth daily. As needed)     Ascorbic Acid  (VITAMIN C) 100 MG tablet Take 100 mg by mouth daily.     CALCIUM PO Take 750 mg by mouth daily.     cyanocobalamin  (VITAMIN B12) 1000 MCG tablet Take 1,000 mcg by mouth daily.     No facility-administered medications prior to visit.     Per HPI unless specifically indicated in ROS section below Review of Systems  Constitutional:  Negative for fatigue and fever.  HENT:  Negative for congestion.   Eyes:  Negative for pain.  Respiratory:  Negative for cough and shortness of breath.   Cardiovascular:  Negative for chest pain, palpitations and leg swelling.  Gastrointestinal:  Negative for abdominal pain.  Genitourinary:  Negative for dysuria and vaginal bleeding.  Musculoskeletal:  Negative for back pain.  Neurological:  Negative for syncope, light-headedness and headaches.  Psychiatric/Behavioral:  Positive for behavioral problems, confusion and sleep disturbance. Negative for dysphoric mood. The patient is nervous/anxious.    Objective:  BP 136/82   Pulse 84   Temp 97.8 F (36.6 C) (Oral)   Ht 5' 1 (1.549 m)   Wt 166 lb 2 oz (75.4 kg)   SpO2 96%   BMI 31.39 kg/m   Wt Readings from Last 3 Encounters:  10/08/24 166 lb 2 oz (75.4 kg)  08/31/24 168 lb 12.8 oz (76.6 kg)  04/27/24 177 lb (80.3 kg)      Physical Exam Constitutional:      General: She is not in acute distress.    Appearance: Normal appearance. She is well-developed. She is not ill-appearing or toxic-appearing.  HENT:     Head: Normocephalic.     Right Ear: Hearing, tympanic membrane, ear canal and external ear normal. Tympanic membrane is not erythematous, retracted or bulging.     Left Ear: Hearing, tympanic membrane, ear canal and external ear normal. Tympanic membrane is not erythematous, retracted or bulging.     Nose: No mucosal edema or rhinorrhea.     Right Sinus: No maxillary sinus tenderness or frontal sinus tenderness.     Left Sinus: No maxillary sinus tenderness or frontal sinus tenderness.      Mouth/Throat:     Pharynx: Uvula midline.  Eyes:     General: Lids are normal. Lids are everted, no foreign bodies appreciated.     Conjunctiva/sclera: Conjunctivae normal.     Pupils: Pupils are equal, round, and reactive to light.  Neck:     Thyroid : No thyroid  mass or thyromegaly.     Vascular: No carotid bruit.  Trachea: Trachea normal.  Cardiovascular:     Rate and Rhythm: Normal rate and regular rhythm.     Pulses: Normal pulses.     Heart sounds: Normal heart sounds, S1 normal and S2 normal. No murmur heard.    No friction rub. No gallop.  Pulmonary:     Effort: Pulmonary effort is normal. No tachypnea or respiratory distress.     Breath sounds: Normal breath sounds. No decreased breath sounds, wheezing, rhonchi or rales.  Abdominal:     General: Bowel sounds are normal.     Palpations: Abdomen is soft.     Tenderness: There is no abdominal tenderness.  Musculoskeletal:     Cervical back: Normal range of motion and neck supple.  Skin:    General: Skin is warm and dry.     Findings: No rash.  Neurological:     Mental Status: She is disoriented and confused.     GCS: GCS eye subscore is 4. GCS verbal subscore is 5. GCS motor subscore is 6.     Cranial Nerves: No cranial nerve deficit.     Sensory: No sensory deficit.     Motor: No abnormal muscle tone.     Coordination: Coordination normal.     Gait: Gait normal.     Deep Tendon Reflexes: Reflexes are normal and symmetric.     Comments: Unable to answer questions directly, word finding issues loses train of thought  Psychiatric:        Attention and Perception: She is inattentive.        Mood and Affect: Mood is not anxious or depressed. Affect is blunt and flat.        Speech: Speech is delayed.        Behavior: Behavior is slowed and withdrawn. Behavior is cooperative.        Thought Content: Thought content normal. Thought content does not include suicidal ideation.        Cognition and Memory: Cognition is  impaired. Memory is impaired. She exhibits impaired recent memory. She does not exhibit impaired remote memory.        Judgment: Judgment is inappropriate.       Results for orders placed or performed in visit on 10/08/24  POCT Urinalysis Dipstick (Automated)   Collection Time: 10/08/24 10:12 AM  Result Value Ref Range   Color, UA yellow    Clarity, UA clear    Glucose, UA Negative Negative   Bilirubin, UA negative    Ketones, UA +/-    Spec Grav, UA 1.025 1.010 - 1.025   Blood, UA negative    pH, UA 5.5 5.0 - 8.0   Protein, UA Negative Negative   Urobilinogen, UA 0.2 0.2 or 1.0 E.U./dL   Nitrite, UA negative    Leukocytes, UA Large (3+) (A) Negative  POCT UA - Microscopic Only   Collection Time: 10/08/24 11:06 AM  Result Value Ref Range   WBC, Ur, HPF, POC tntc 0 - 5   RBC, Urine, Miroscopic     Bacteria, U Microscopic many None - Trace   Mucus, UA     Epithelial cells, urine per micros tntc    Crystals, Ur, HPF, POC     Casts, Ur, LPF, POC     Yeast, UA      Assessment and Plan  Altered mental status, unspecified altered mental status type Assessment & Plan: Subacute worsening of chronic issue. Strong family history of Alzheimer's in patient's mother and sister. Very likely  new diagnosis of dementia although I do think mood issues are contributing. Given worsening in the last 3 months, we considered possible secondary cause.  Labs in September including CBC, vitamin levels, thyroid  function and complete metabolic panel were normal.   Upon review past 2 urine cultures have returned showing no growth or mixed urogenital bacteria. No clear sign of urinary tract infection causing confusion.  Urinalysis in office contaminated with skin cells per micro. No other sign of infection such as cellulitis, upper respiratory infection, GI infection etc.  No acute constipation.  She has an appointment with neurology tomorrow for mental status exam and further  recommendations.   Orders: -     POCT Urinalysis Dipstick (Automated) -     POCT UA - Microscopic Only    No follow-ups on file.   Greig Ring, MD

## 2024-10-09 DIAGNOSIS — R413 Other amnesia: Secondary | ICD-10-CM | POA: Diagnosis not present

## 2024-10-09 DIAGNOSIS — K5900 Constipation, unspecified: Secondary | ICD-10-CM | POA: Diagnosis not present

## 2024-10-09 DIAGNOSIS — F0390 Unspecified dementia without behavioral disturbance: Secondary | ICD-10-CM | POA: Diagnosis not present

## 2024-10-09 DIAGNOSIS — G479 Sleep disorder, unspecified: Secondary | ICD-10-CM | POA: Diagnosis not present

## 2024-10-09 DIAGNOSIS — G20C Parkinsonism, unspecified: Secondary | ICD-10-CM | POA: Diagnosis not present

## 2024-10-09 DIAGNOSIS — R1310 Dysphagia, unspecified: Secondary | ICD-10-CM | POA: Diagnosis not present

## 2024-10-12 ENCOUNTER — Other Ambulatory Visit: Payer: Self-pay | Admitting: Neurology

## 2024-10-12 ENCOUNTER — Telehealth: Payer: Self-pay | Admitting: Nurse Practitioner

## 2024-10-12 DIAGNOSIS — R413 Other amnesia: Secondary | ICD-10-CM

## 2024-10-12 NOTE — Telephone Encounter (Signed)
 Copied from CRM #[id number]. Topic: Referral - Question >> Oct 09, 2024 10:43 AM Thersia BROCKS wrote: Reason for CRM: Patient niece called in regarding referral for [facility], stated the wrong birth date for the referral, wanted to make sure that all her records have the correct date as 1941-11-03

## 2024-10-12 NOTE — Telephone Encounter (Addendum)
 Left detailed voicemail for patient to call the office back if any concerns.

## 2024-10-16 ENCOUNTER — Ambulatory Visit
Admission: RE | Admit: 2024-10-16 | Discharge: 2024-10-16 | Disposition: A | Discharge status: Home / Self Care | Source: Ambulatory Visit | Attending: Neurology | Admitting: Neurology

## 2024-10-16 DIAGNOSIS — R413 Other amnesia: Secondary | ICD-10-CM | POA: Diagnosis not present

## 2024-10-16 DIAGNOSIS — J341 Cyst and mucocele of nose and nasal sinus: Secondary | ICD-10-CM | POA: Diagnosis not present

## 2024-10-19 DIAGNOSIS — R32 Unspecified urinary incontinence: Secondary | ICD-10-CM | POA: Diagnosis not present

## 2024-10-19 DIAGNOSIS — M8580 Other specified disorders of bone density and structure, unspecified site: Secondary | ICD-10-CM | POA: Diagnosis not present

## 2024-10-19 DIAGNOSIS — F320 Major depressive disorder, single episode, mild: Secondary | ICD-10-CM | POA: Diagnosis not present

## 2024-10-19 DIAGNOSIS — Z7982 Long term (current) use of aspirin: Secondary | ICD-10-CM | POA: Diagnosis not present

## 2024-10-19 DIAGNOSIS — E785 Hyperlipidemia, unspecified: Secondary | ICD-10-CM | POA: Diagnosis not present

## 2024-10-19 DIAGNOSIS — Z5948 Other specified lack of adequate food: Secondary | ICD-10-CM | POA: Diagnosis not present

## 2024-10-19 DIAGNOSIS — N182 Chronic kidney disease, stage 2 (mild): Secondary | ICD-10-CM | POA: Diagnosis not present

## 2024-10-19 DIAGNOSIS — Z87440 Personal history of urinary (tract) infections: Secondary | ICD-10-CM | POA: Diagnosis not present

## 2024-10-19 DIAGNOSIS — F419 Anxiety disorder, unspecified: Secondary | ICD-10-CM | POA: Diagnosis not present

## 2024-10-19 DIAGNOSIS — Z5941 Food insecurity: Secondary | ICD-10-CM | POA: Diagnosis not present

## 2024-10-19 DIAGNOSIS — L309 Dermatitis, unspecified: Secondary | ICD-10-CM | POA: Diagnosis not present

## 2024-10-19 DIAGNOSIS — G3184 Mild cognitive impairment, so stated: Secondary | ICD-10-CM | POA: Diagnosis not present

## 2024-10-19 DIAGNOSIS — G4700 Insomnia, unspecified: Secondary | ICD-10-CM | POA: Diagnosis not present

## 2024-10-20 NOTE — Telephone Encounter (Signed)
 Patient called back for lab results, Verbalized understanding of lab results.

## 2024-12-04 ENCOUNTER — Other Ambulatory Visit: Payer: Self-pay | Admitting: Nurse Practitioner

## 2024-12-04 ENCOUNTER — Telehealth: Payer: Self-pay | Admitting: Nurse Practitioner

## 2024-12-04 MED ORDER — FLUOXETINE HCL 10 MG PO CAPS: 10.0000 mg | ORAL_CAPSULE | Freq: Every day | ORAL | 1 refills | Status: AC

## 2024-12-04 NOTE — Addendum Note (Signed)
 Addended by: WENDEE LYNWOOD HERO on: 12/04/2024 12:54 PM   Modules accepted: Orders

## 2024-12-04 NOTE — Telephone Encounter (Signed)
 States taking last pill today  Is asking for the medication to be refilled today  Prescription Request  12/04/2024  LOV: 08/31/2024  What is the name of the medication or equipment?  FLUoxetine  (PROZAC ) 10 MG capsule  Have you contacted your pharmacy to request a refill? Yes   Which pharmacy would you like this sent to?  [id number] - [place], [place] - [address] [place] [address] Phone: [phone] Fax: [phone]    Patient notified that their request is being sent to the clinical staff for review and that they should receive a response within 2 business days.   Please advise at Mobile [phone]

## 2025-02-10 ENCOUNTER — Ambulatory Visit

## 2025-03-01 ENCOUNTER — Ambulatory Visit: Admitting: Nurse Practitioner
